# Patient Record
Sex: Female | Born: 2000 | Race: White | Hispanic: No | Marital: Single | Smoking: Current every day smoker
Health system: Southern US, Community
[De-identification: ages and names within clinical notes are randomized; demographics above are authoritative.]

---
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
## 2021-12-29 ENCOUNTER — Emergency Department (HOSPITAL_COMMUNITY): Payer: Medicaid Other

## 2021-12-29 ENCOUNTER — Encounter (HOSPITAL_COMMUNITY): Payer: Self-pay

## 2021-12-29 ENCOUNTER — Emergency Department (HOSPITAL_COMMUNITY)
Admission: EM | Admit: 2021-12-29 | Discharge: 2021-12-29 | Disposition: A | Discharge status: Home / Self Care | Payer: Medicaid Other | Attending: Emergency Medicine | Admitting: Emergency Medicine

## 2021-12-29 ENCOUNTER — Other Ambulatory Visit: Payer: Self-pay

## 2021-12-29 DIAGNOSIS — R102 Pelvic and perineal pain: Secondary | ICD-10-CM | POA: Insufficient documentation

## 2021-12-29 DIAGNOSIS — N939 Abnormal uterine and vaginal bleeding, unspecified: Secondary | ICD-10-CM | POA: Insufficient documentation

## 2021-12-29 LAB — CBC WITH DIFFERENTIAL/PLATELET
Abs Immature Granulocytes: 0.03 10*3/uL (ref 0.00–0.07)
Basophils Absolute: 0 10*3/uL (ref 0.0–0.1)
Basophils Relative: 0 %
Eosinophils Absolute: 0.1 10*3/uL (ref 0.0–0.5)
Eosinophils Relative: 1 %
HCT: 40.8 % (ref 36.0–46.0)
Hemoglobin: 13.6 g/dL (ref 12.0–15.0)
Immature Granulocytes: 0 %
Lymphocytes Relative: 29 %
Lymphs Abs: 3.1 10*3/uL (ref 0.7–4.0)
MCH: 30 pg (ref 26.0–34.0)
MCHC: 33.3 g/dL (ref 30.0–36.0)
MCV: 89.9 fL (ref 80.0–100.0)
Monocytes Absolute: 0.6 10*3/uL (ref 0.1–1.0)
Monocytes Relative: 6 %
Neutro Abs: 6.8 10*3/uL (ref 1.7–7.7)
Neutrophils Relative %: 64 %
Platelets: 294 10*3/uL (ref 150–400)
RBC: 4.54 MIL/uL (ref 3.87–5.11)
RDW: 12.4 % (ref 11.5–15.5)
WBC: 10.6 10*3/uL — ABNORMAL HIGH (ref 4.0–10.5)
nRBC: 0 % (ref 0.0–0.2)

## 2021-12-29 LAB — URINALYSIS, ROUTINE W REFLEX MICROSCOPIC
Bilirubin Urine: NEGATIVE
Glucose, UA: NEGATIVE mg/dL
Ketones, ur: NEGATIVE mg/dL
Leukocytes,Ua: NEGATIVE
Nitrite: NEGATIVE
Protein, ur: NEGATIVE mg/dL
RBC / HPF: >50 RBC/hpf — ABNORMAL HIGH (ref 0–5)
Specific Gravity, Urine: 1.008 (ref 1.005–1.030)
pH: 7 (ref 5.0–8.0)

## 2021-12-29 LAB — BASIC METABOLIC PANEL
Anion gap: 7 (ref 5–15)
BUN: 10 mg/dL (ref 6–20)
CO2: 23 mmol/L (ref 22–32)
Calcium: 9.6 mg/dL (ref 8.9–10.3)
Chloride: 110 mmol/L (ref 98–111)
Creatinine, Ser: 0.53 mg/dL (ref 0.44–1.00)
GFR, Estimated: >60 mL/min (ref >60)
Glucose, Bld: 94 mg/dL (ref 70–99)
Potassium: 4.4 mmol/L (ref 3.5–5.1)
Sodium: 140 mmol/L (ref 135–145)

## 2021-12-29 LAB — PREGNANCY, URINE: Preg Test, Ur: NEGATIVE

## 2021-12-29 MED ORDER — IBUPROFEN 200 MG PO TABS
400.0000 mg | ORAL_TABLET | Freq: Once | ORAL | Status: AC
  Administered 2021-12-29: 400 mg via ORAL
  Filled 2021-12-29: qty 2

## 2021-12-29 MED ORDER — ACETAMINOPHEN 500 MG PO TABS
1000.0000 mg | ORAL_TABLET | Freq: Once | ORAL | Status: AC
  Administered 2021-12-29: 1000 mg via ORAL
  Filled 2021-12-29: qty 2

## 2021-12-29 NOTE — ED Provider Notes (Signed)
[facility]   CSN: [id number] Arrival date & time: 12/29/21  1043     History  Chief Complaint  Patient presents with   Vaginal Bleeding    Denise Hendrix is a 21 y.o. female.  Patient is a 21 year old female presenting today with complaints of left pelvic pain and vaginal bleeding.  Patient reports that the pain is now been present for 2 to 3 days but she has had it intermittently in the past.  She had her last menstrual cycle at the end of May which she reports at times are heavier than others but then a few days ago when this pain started she started getting vaginal spotting as well.  She reports that she is using 1-2 pads per day.  However the pain is getting more severe and she had to leave work today.  She denies any urinary symptoms.  She was last sexually active 3 weeks ago but always uses protection.  She is on birth control which she has been on consistently since last July.  She denies any fever or vomiting but has had nausea.  She has not taken any medication for the pain.  No prior pelvic surgeries but does report an abortion last fall at approximately 3 months pregnant.  The history is provided by the patient.  Vaginal Bleeding      Home Medications Prior to Admission medications   Medication Sig Start Date End Date Taking? Authorizing Provider  Calcium Carbonate Antacid (TUMS PO) Take 2 tablets by mouth daily as needed (diarrhea).   Yes [provider]  ibuprofen (ADVIL) 200 MG tablet Take 400 mg by mouth daily as needed (pain).   Yes [provider]  Multiple Vitamin (MULTIVITAMIN PO) Take 2 tablets by mouth daily.   Yes [provider]  norgestimate-ethinyl estradiol (ORTHO-CYCLEN) 0.25-35 MG-MCG tablet Take 1 tablet by mouth daily. 12/13/21  Yes [provider]  cephALEXin (KEFLEX) 500 MG capsule Take 1 capsule (500 mg total) by mouth 3 (three) times daily. Patient not taking:  Reported on 12/29/2021 01/13/16   Trixie Dredge, PA-C  phenazopyridine (PYRIDIUM) 200 MG tablet Take 1 tablet (200 mg total) by mouth 3 (three) times daily. Patient not taking: Reported on 12/29/2021 01/13/16   Trixie Dredge, PA-C      Allergies    Patient has no known allergies.    Review of Systems   Review of Systems  Genitourinary:  Positive for vaginal bleeding.    Physical Exam Updated Vital Signs BP 100/89 (BP Location: Right Arm)   Pulse 79   Temp 99.7 F (37.6 C) (Oral)   Resp 18   Ht 5' 1.5" (1.562 m)   Wt 91.6 kg   LMP 12/26/2021   SpO2 100%   BMI 37.53 kg/m  Physical Exam Vitals and nursing note reviewed.  Constitutional:      General: She is not in acute distress.    Appearance: She is well-developed.     Comments: Patient is currently tearful and reports she is dealing with an anxiety attack  HENT:     Head: Normocephalic and atraumatic.  Eyes:     Pupils: Pupils are equal, round, and reactive to light.  Cardiovascular:     Rate and Rhythm: Normal rate and regular rhythm.     Heart sounds: Normal heart sounds. No murmur heard.    No friction rub.  Pulmonary:     Effort: Pulmonary effort is normal.     Breath  sounds: Normal breath sounds. No wheezing or rales.  Abdominal:     General: Bowel sounds are normal. There is no distension.     Palpations: Abdomen is soft.     Tenderness: There is no abdominal tenderness. There is no guarding or rebound.    Musculoskeletal:        General: No tenderness. Normal range of motion.     Comments: No edema  Skin:    General: Skin is warm and dry.     Findings: No rash.  Neurological:     Mental Status: She is alert and oriented to person, place, and time. Mental status is at baseline.     Cranial Nerves: No cranial nerve deficit.  Psychiatric:        Mood and Affect: Mood is anxious. Affect is tearful.        Behavior: Behavior normal.     ED Results / Procedures / Treatments   Labs (all labs ordered are  listed, but only abnormal results are displayed) Labs Reviewed  CBC WITH DIFFERENTIAL/PLATELET - Abnormal; Notable for the following components:      Result Value   WBC 10.6 (*)    All other components within normal limits  URINALYSIS, ROUTINE W REFLEX MICROSCOPIC - Abnormal; Notable for the following components:   Color, Urine STRAW (*)    Hgb urine dipstick LARGE (*)    RBC / HPF >50 (*)    Bacteria, UA RARE (*)    All other components within normal limits  BASIC METABOLIC PANEL  PREGNANCY, URINE    EKG None  Radiology US Pelvis Complete  Result Date: 12/29/2021 CLINICAL DATA:  Vaginal bleeding and pelvic pain EXAM: TRANSABDOMINAL AND TRANSVAGINAL ULTRASOUND OF PELVIS DOPPLER ULTRASOUND OF OVARIES TECHNIQUE: Both transabdominal and transvaginal ultrasound examinations of the pelvis were performed. Transabdominal technique was performed for global imaging of the pelvis including uterus, ovaries, adnexal regions, and pelvic cul-de-sac. It was necessary to proceed with endovaginal exam following the transabdominal exam to visualize the uterus and ovaries. Color and duplex Doppler ultrasound was utilized to evaluate blood flow to the ovaries. COMPARISON:  None Available. FINDINGS: Uterus Measurements: 6.0 x 2.5 x 3.6 cm = volume: 29 mL. No fibroids or other mass visualized. Nabothian cyst noted. Endometrium Thickness: 3.3 mm.  No focal abnormality visualized. Right ovary Measurements: 1.7 x 1.6 x 1.5 cm = volume: 2.0 mL. Normal appearance/no adnexal mass. Left ovary Measurements: 3.9 x 1.7 x 1.6 cm = volume: 5.4 mL. Normal appearance/no adnexal mass. Pulsed Doppler evaluation of both ovaries demonstrates normal low-resistance arterial and venous waveforms. Other findings No abnormal free fluid. IMPRESSION: Normal pelvic ultrasound. Electronically Signed   By: Allegra Lai M.D.   On: 12/29/2021 15:50   US Transvaginal Non-OB  Result Date: 12/29/2021 CLINICAL DATA:  Vaginal bleeding and  pelvic pain EXAM: TRANSABDOMINAL AND TRANSVAGINAL ULTRASOUND OF PELVIS DOPPLER ULTRASOUND OF OVARIES TECHNIQUE: Both transabdominal and transvaginal ultrasound examinations of the pelvis were performed. Transabdominal technique was performed for global imaging of the pelvis including uterus, ovaries, adnexal regions, and pelvic cul-de-sac. It was necessary to proceed with endovaginal exam following the transabdominal exam to visualize the uterus and ovaries. Color and duplex Doppler ultrasound was utilized to evaluate blood flow to the ovaries. COMPARISON:  None Available. FINDINGS: Uterus Measurements: 6.0 x 2.5 x 3.6 cm = volume: 29 mL. No fibroids or other mass visualized. Nabothian cyst noted. Endometrium Thickness: 3.3 mm.  No focal abnormality visualized. Right ovary Measurements: 1.7 x  1.6 x 1.5 cm = volume: 2.0 mL. Normal appearance/no adnexal mass. Left ovary Measurements: 3.9 x 1.7 x 1.6 cm = volume: 5.4 mL. Normal appearance/no adnexal mass. Pulsed Doppler evaluation of both ovaries demonstrates normal low-resistance arterial and venous waveforms. Other findings No abnormal free fluid. IMPRESSION: Normal pelvic ultrasound. Electronically Signed   By: Allegra Lai M.D.   On: 12/29/2021 15:50   [place] Art/Ven Flow Abd Pelv Doppler  Result Date: 12/29/2021 CLINICAL DATA:  Vaginal bleeding and pelvic pain EXAM: TRANSABDOMINAL AND TRANSVAGINAL ULTRASOUND OF PELVIS DOPPLER ULTRASOUND OF OVARIES TECHNIQUE: Both transabdominal and transvaginal ultrasound examinations of the pelvis were performed. Transabdominal technique was performed for global imaging of the pelvis including uterus, ovaries, adnexal regions, and pelvic cul-de-sac. It was necessary to proceed with endovaginal exam following the transabdominal exam to visualize the uterus and ovaries. Color and duplex Doppler ultrasound was utilized to evaluate blood flow to the ovaries. COMPARISON:  None Available. FINDINGS: Uterus Measurements: 6.0 x 2.5 x  3.6 cm = volume: 29 mL. No fibroids or other mass visualized. Nabothian cyst noted. Endometrium Thickness: 3.3 mm.  No focal abnormality visualized. Right ovary Measurements: 1.7 x 1.6 x 1.5 cm = volume: 2.0 mL. Normal appearance/no adnexal mass. Left ovary Measurements: 3.9 x 1.7 x 1.6 cm = volume: 5.4 mL. Normal appearance/no adnexal mass. Pulsed Doppler evaluation of both ovaries demonstrates normal low-resistance arterial and venous waveforms. Other findings No abnormal free fluid. IMPRESSION: Normal pelvic ultrasound. Electronically Signed   By: Allegra Lai M.D.   On: 12/29/2021 15:50    Procedures Procedures    Medications Ordered in ED Medications  acetaminophen (TYLENOL) tablet 1,000 mg (1,000 mg Oral Given 12/29/21 1411)  ibuprofen (ADVIL) tablet 400 mg (400 mg Oral Given 12/29/21 1411)    ED Course/ Medical Decision Making/ A&P                           Medical Decision Making Amount and/or Complexity of Data Reviewed External Data Reviewed: notes. Labs: ordered. Decision-making details documented in ED Course. Radiology: ordered and independent interpretation performed. Decision-making details documented in ED Course.  Risk OTC drugs.   Patient is a healthy female presenting with a complaint that can carry high concern for morbidity and mortality presenting today with left pelvic pain.  She is having associated vaginal spotting.  She does take OCPs and does not have a prior history of [place] pathology.  She denies any urinary pathology but concern for renal stone versus ovarian cyst versus intermittent torsion versus PID.  Lower suspicion for pregnancy as patient's menses have been regular and she is taking OCPs.  Patient currently has reduced pain just with time and is now 5 out of 10.  Will give Tylenol and ibuprofen.  Ultrasound and labs are pending.  4:11 PM I have independently visualized and interpreted pt's images today. Ultrasound today without evidence of ovarian  cysts.  Radiology reports a normal pelvic ultrasound with Doppler to both ovaries being low resistance arterial and venous waveforms.  I independently interpreted patient's labs today which showed negative pregnancy, blood in the UA which could be contaminant from known vaginal bleeding, normal CBC and BMP.  Findings were discussed with the patient.  This has been an ongoing issue for her.  Did discuss the possibility that this could be a renal stone and discussed renal CT however with shared decision making patient did not want to undergo any further testing at this  time.  Even if it was renal stone should pass on its own and normal renal function.  No evidence of infection.  Low suspicion for torsion at this time.  Encourage patient to follow-up with OB/GYN and continue on her OCPs at this time.  She was given return precautions.        Final Clinical Impression(s) / ED Diagnoses Final diagnoses:  Pelvic pain  Abnormal uterine bleeding (AUB)    Rx / DC Orders ED Discharge Orders     None         Gwyneth Sprout, MD 12/29/21 1611

## 2021-12-29 NOTE — ED Provider Triage Note (Signed)
Emergency Medicine Provider Triage Evaluation Note  Denise Hendrix , a 21 y.o. female  was evaluated in triage.  Pt complains of left pelvic pain and flank pain associated with vaginal bleeding of about 2 to 3-day duration.  Reports heavy vaginal bleeding.  Does take birth control.  Does not have a GYN.  Intermittent lightheadedness.  Currently denies lightheadedness.  Denies nausea, vomiting, or fever.  Review of Systems  Positive: As above Negative: As above  Physical Exam  BP 130/79 (BP Location: Left Arm)   Pulse 82   Temp 99.7 F (37.6 C) (Oral)   Resp 14   Ht 5' 1.5" (1.562 m)   Wt 91.6 kg   LMP 12/26/2021   SpO2 100%   BMI 37.53 kg/m  Gen:   Awake, no distress  Resp:  Normal effort  MSK:   Moves extremities without difficulty Other:  Left lower quadrant abdominal tenderness.  Mild left flank tenderness present  Medical Decision Making  Medically screening exam initiated at 12:38 PM.  Appropriate orders placed.  Denise Hendrix was informed that the remainder of the evaluation will be completed by another provider, this initial triage assessment does not replace that evaluation, and the importance of remaining in the ED until their evaluation is complete.     Marita Kansas, PA-C 12/29/21 1239

## 2021-12-29 NOTE — Discharge Instructions (Signed)
The ultrasound was normal today and all the labs look normal except for blood in your urine.  It is possible that this could be a kidney stone however you would just take Tylenol and ibuprofen until it passes.  It is more likely to be something related to your female organs possibly endometriosis.  I encourage you to continue your birth control pill at this time but it is important that you follow-up with an OB/GYN.  If you start having high fever, vomiting, excruciating pain please return to the emergency room.

## 2021-12-29 NOTE — ED Triage Notes (Addendum)
Patient c/o heavy vaginal bleeding with clots x 2-3 days.  Patient also c/o left lower abdominal pain that radiates into the the left lower back.

## 2022-06-24 ENCOUNTER — Ambulatory Visit
Admission: EM | Admit: 2022-06-24 | Discharge: 2022-06-24 | Disposition: A | Discharge status: Home / Self Care | Payer: Medicaid Other | Attending: Physician Assistant | Admitting: Physician Assistant

## 2022-06-24 DIAGNOSIS — S43402A Unspecified sprain of left shoulder joint, initial encounter: Secondary | ICD-10-CM

## 2022-06-24 MED ORDER — TIZANIDINE HCL 4 MG PO TABS: 4.0000 mg | ORAL_TABLET | Freq: Four times a day (QID) | ORAL | 0 refills | Status: AC | PRN

## 2022-06-24 NOTE — ED Provider Notes (Signed)
[facility]    CSN: [id number] Arrival date & time: 06/24/22  1433      History   Chief Complaint No chief complaint on file.   HPI Denise Hendrix is a 21 y.o. female.   Patient here for evaluation of L shoulder / back pain x 1 day.  This has been on going since 12/2021 when she had work related injury. She reports she has attempted to file [facility] but has been denied.  She states she was at work when she was removing box of tea from shelf and felt pain in shoulder.  She has been taking nsaids w/o relief.      History reviewed. No pertinent past medical history.  There are no problems to display for this patient.   History reviewed. No pertinent surgical history.  OB History   No obstetric history on file.      Home Medications    Prior to Admission medications   Medication Sig Start Date End Date Taking? Authorizing Provider  tiZANidine (ZANAFLEX) 4 MG tablet Take 1 tablet (4 mg total) by mouth every 6 (six) hours as needed for muscle spasms. 06/24/22  Yes Peri Jefferson, PA-C  Calcium Carbonate Antacid (TUMS PO) Take 2 tablets by mouth daily as needed (diarrhea).    [provider]  cephALEXin (KEFLEX) 500 MG capsule Take 1 capsule (500 mg total) by mouth 3 (three) times daily. Patient not taking: Reported on 12/29/2021 01/13/16   Clayton Bibles, PA-C  ibuprofen (ADVIL) 200 MG tablet Take 400 mg by mouth daily as needed (pain).    [provider]  Multiple Vitamin (MULTIVITAMIN PO) Take 2 tablets by mouth daily.    [provider]  norgestimate-ethinyl estradiol (ORTHO-CYCLEN) 0.25-35 MG-MCG tablet Take 1 tablet by mouth daily. 12/13/21   [provider]  phenazopyridine (PYRIDIUM) 200 MG tablet Take 1 tablet (200 mg total) by mouth 3 (three) times daily. Patient not taking: Reported on 12/29/2021 01/13/16   Clayton Bibles, PA-C    Family History History reviewed. No pertinent family history.  Social History Social History    Tobacco Use   Smoking status: Every Day    Types: E-cigarettes  Vaping Use   Vaping Use: Some days   Substances: Nicotine, Flavoring  Substance Use Topics   Alcohol use: Never   Drug use: Never     Allergies   Patient has no known allergies.   Review of Systems Review of Systems  Musculoskeletal:  Positive for arthralgias and myalgias. Negative for joint swelling.  Skin:  Negative for color change and wound.  Neurological:  Positive for weakness. Negative for numbness.  Hematological:  Negative for adenopathy. Does not bruise/bleed easily.     Physical Exam Triage Vital Signs ED Triage Vitals  Enc Vitals Group     BP 06/24/22 1604 123/63     Pulse Rate 06/24/22 1604 70     Resp 06/24/22 1604 16     Temp 06/24/22 1604 98.9 F (37.2 C)     Temp Source 06/24/22 1604 Oral     SpO2 06/24/22 1604 100 %     Weight --      Height --      Head Circumference --      Peak Flow --      Pain Score 06/24/22 1610 6     Pain Loc --      Pain Edu? --      Excl. in [place]? --    No data  found.  Updated Vital Signs BP 123/63 (BP Location: Right Arm)   Pulse 70   Temp 98.9 F (37.2 C) (Oral)   Resp 16   LMP 06/21/2022   SpO2 100%   Visual Acuity Right Eye Distance:   Left Eye Distance:   Bilateral Distance:    Right Eye Near:   Left Eye Near:    Bilateral Near:     Physical Exam Vitals and nursing note reviewed.  Constitutional:      General: She is not in acute distress.    Appearance: Normal appearance. She is not ill-appearing.  HENT:     Head: Normocephalic and atraumatic.  Eyes:     General: No scleral icterus.    Extraocular Movements: Extraocular movements intact.     Conjunctiva/sclera: Conjunctivae normal.  Cardiovascular:     Rate and Rhythm: Normal rate and regular rhythm.     Heart sounds: No murmur heard. Pulmonary:     Effort: Pulmonary effort is normal. No respiratory distress.     Breath sounds: Normal breath sounds. No wheezing or rales.   Musculoskeletal:     Cervical back: Normal range of motion. No rigidity.     Comments: Tenderness L posterior shoulder, ROM grossly intact though pain with abduction above her head.  Pain L side upper thoracic region along trapezious  Skin:    Coloration: Skin is not jaundiced.     Findings: No rash.  Neurological:     General: No focal deficit present.     Mental Status: She is alert and oriented to person, place, and time.     Motor: No weakness.     Gait: Gait normal.  Psychiatric:        Mood and Affect: Mood normal.        Behavior: Behavior normal.      UC Treatments / Results  Labs (all labs ordered are listed, but only abnormal results are displayed) Labs Reviewed - No data to display  EKG   Radiology No results found.  Procedures Procedures (including critical care time)  Medications Ordered in UC Medications - No data to display  Initial Impression / Assessment and Plan / UC Course  I have reviewed the triage vital signs and the nursing notes.  Pertinent labs & imaging results that were available during my care of the patient were reviewed by me and considered in my medical decision making (see chart for details).     Follow up with ortho. Apply ice and heat to shoulder 15 minutes 4 times per day No lifting over 5 pounds with L arm Final Clinical Impressions(s) / UC Diagnoses   Final diagnoses:  Sprain of left shoulder, unspecified shoulder sprain type, initial encounter     Discharge Instructions      Follow up with ortho. Apply ice and heat to shoulder 15 minutes 4 times per day    ED Prescriptions     Medication Sig Dispense Auth. Provider   tiZANidine (ZANAFLEX) 4 MG tablet Take 1 tablet (4 mg total) by mouth every 6 (six) hours as needed for muscle spasms. 30 tablet Evern Core, PA-C      PDMP not reviewed this encounter.   Evern Core, PA-C 06/24/22 1637

## 2022-06-24 NOTE — Discharge Instructions (Signed)
Follow up with ortho. Apply ice and heat to shoulder 15 minutes 4 times per day

## 2022-06-24 NOTE — ED Triage Notes (Addendum)
Pt states she was dx with "pulled trap muscle" in June after an injury at work-c/o pain to area again after injury at work yesterday-NAD-steady gait

## 2022-06-28 ENCOUNTER — Ambulatory Visit (INDEPENDENT_AMBULATORY_CARE_PROVIDER_SITE_OTHER): Payer: Medicaid Other

## 2022-06-28 ENCOUNTER — Ambulatory Visit (INDEPENDENT_AMBULATORY_CARE_PROVIDER_SITE_OTHER): Payer: Medicaid Other | Admitting: Surgical

## 2022-06-28 DIAGNOSIS — M25512 Pain in left shoulder: Secondary | ICD-10-CM

## 2022-06-28 MED ORDER — NAPROXEN 500 MG PO TABS: 500.0000 mg | ORAL_TABLET | Freq: Two times a day (BID) | ORAL | 0 refills | Status: AC

## 2022-07-02 ENCOUNTER — Encounter: Payer: Self-pay | Admitting: Surgical

## 2022-07-02 NOTE — Progress Notes (Signed)
Office Visit Note   Patient: Denise Hendrix           Date of Birth: 08/25/2000           MRN: [id number] Visit Date: 06/28/2022 Requested by: Paschal Dopp, PA [address] [place],  [place] [address] PCP: Paschal Dopp, PA  Subjective: Chief Complaint  Patient presents with   Left Shoulder - Pain    HPI: Denise Hendrix is a 21 y.o. female who presents to the office reporting left shoulder pain.  Patient states that she has a history of left shoulder pain stemming from an injury on 12/24/2021 that occurred at work.  She was pulling down a pan off of a high shelf at [employer].  This pain was a lot heavier than she was expecting and she felt a pull in her left shoulder as she brought it down and had to actually drop it on the floor just because she could not hold it up.  She had pain at that time but this subsided until the next morning when she had increased pain and had difficulty using her left arm.  She had difficulty laying on her left side at that time and she states she tried to have that covered under [employer]. but it was denied.  Her pain improved about 85% before she recently reinjured the shoulder last Friday.  She was pushing a approximately 20 pound box and caught the box on the other side of a shelf and it pulled her shoulder again.  This is the same shoulder that was previously injured.  She has noticed that she has decreased range of motion secondary to pain.  Cannot really lift without any pain in the shoulder.  Mostly localizes pain to the lateral aspect of the shoulder as well as the posterior aspect and the trapezius region.  Denies any neck pain, numbness/tingling, medial border scapular pain, radicular pain down the arm.  She was given muscle relaxer at urgent care.  Cannot lay on her left side again.  No history of prior dislocation or surgery to the shoulder.  In her free time she enjoys dancing and spending time with her boyfriend..  She does not do  any major sporting events or weightlifting              ROS: All systems reviewed are negative as they relate to the chief complaint within the history of present illness.  Patient denies fevers or chills.  Assessment & Plan: Visit Diagnoses:  1. Left shoulder pain, unspecified chronicity     Plan: Patient is a 21 year old female who presents for evaluation of left shoulder pain.  She has had index injury of the left shoulder on 12/24/2021 when she injured herself while working at [employer].  She had Worker's Comp. claim that was denied and she is pursuing legal intervention in order to get this covered.  She initially got about 85% better after that injury up until last Friday when she reinjured her left shoulder and has had recurrence of similar pain.  She has radiographs demonstrating no significant osseous abnormality.  No rotator cuff weakness but she does have positive O'Brien sign on exam.  Discussed the options available to patient including trying formal physical therapy versus injection versus MRI arthrogram for further evaluation of potential labral injury.  Not having any instability at this time.  She would like to proceed with MRI arthrogram which is reasonable given the longstanding nature of her symptoms over the last  6 months.  Follow-up after MRI to review results.  Follow-Up Instructions: No follow-ups on file.   Orders:  Orders Placed This Encounter  Procedures   XR Shoulder Left   MR Shoulder Left w/ contrast   Arthrogram   Meds ordered this encounter  Medications   naproxen (NAPROSYN) 500 MG tablet    Sig: Take 1 tablet (500 mg total) by mouth 2 (two) times daily with a meal.    Dispense:  28 tablet    Refill:  0      Procedures: No procedures performed   Clinical Data: No additional findings.  Objective: Vital Signs: LMP 06/21/2022   Physical Exam:  Constitutional: Patient appears well-developed HEENT:  Head: Normocephalic Eyes:EOM are normal Neck:  Normal range of motion Cardiovascular: Normal rate Pulmonary/chest: Effort normal Neurologic: Patient is alert Skin: Skin is warm Psychiatric: Patient has normal mood and affect  Ortho Exam: Ortho exam demonstrates left shoulder with 70 degrees external rotation, 100 degrees abduction, 170 degrees forward flexion which is equivalent to the right shoulder range of motion.  She has excellent rotator cuff strength of supra, infra, subscap of both shoulders without any coarseness or grinding with passive motion of the shoulder.  No tenderness over the [facility] joint.  No tenderness over the acromion.  No tenderness over the bicipital groove.  Does have positive O'Brien sign.  No tenderness over the axial cervical spine.  Negative Spurling sign.  Negative Lhermitte sign.  Intact EPL, FPL, grip strength, finger abduction, pronation/supination, bicep, tricep, deltoid.  Axillary nerve intact with deltoid firing.  Negative apprehension sign.  Negative Hornblower sign.  Negative external rotation lag sign.  Specialty Comments:  No specialty comments available.  Imaging: No results found.   PMFS History: There are no problems to display for this patient.  No past medical history on file.  No family history on file.  No past surgical history on file. Social History   Occupational History   Not on file  Tobacco Use   Smoking status: Every Day    Types: E-cigarettes   Smokeless tobacco: Not on file  Vaping Use   Vaping Use: Some days   Substances: Nicotine, Flavoring  Substance and Sexual Activity   Alcohol use: Never   Drug use: Never   Sexual activity: Not on file

## 2022-07-25 ENCOUNTER — Ambulatory Visit
Admission: RE | Admit: 2022-07-25 | Discharge: 2022-07-25 | Disposition: A | Discharge status: Home / Self Care | Payer: Medicaid Other | Source: Ambulatory Visit | Attending: Surgical | Admitting: Surgical

## 2022-07-25 DIAGNOSIS — M25512 Pain in left shoulder: Secondary | ICD-10-CM

## 2022-07-25 MED ORDER — IOPAMIDOL (ISOVUE-M 200) INJECTION 41%
13.0000 mL | Freq: Once | INTRAMUSCULAR | Status: AC
  Administered 2022-07-25: 13 mL via INTRA_ARTICULAR

## 2022-08-02 ENCOUNTER — Encounter: Payer: Self-pay | Admitting: Surgical

## 2022-08-02 ENCOUNTER — Ambulatory Visit (INDEPENDENT_AMBULATORY_CARE_PROVIDER_SITE_OTHER): Payer: Medicaid Other | Admitting: Surgical

## 2022-08-02 DIAGNOSIS — M25512 Pain in left shoulder: Secondary | ICD-10-CM | POA: Diagnosis not present

## 2022-08-02 NOTE — Progress Notes (Signed)
Office Visit Note   Patient: Denise Hendrix           Date of Birth: 22-Sep-2000           MRN: [id number] Visit Date: 08/02/2022 Requested by: Milford Cage, [place],  [place] [address] PCP: Milford Cage, PA  Subjective: Chief Complaint  Patient presents with   Left Shoulder - Pain    HPI: Denise Hendrix is a 22 y.o. female who presents to the office for MRI review. Patient denies any changes in symptoms aside from her symptoms being slightly improved.  Continues to complain mainly of occasional clicking sensation in the posterior aspect of the shoulder.  Has primarily posterior pain with no scapular pain or neck pain.  Occasional anterior pain as well.  MRI results revealed: MR Shoulder Left w/ contrast  Result Date: 07/26/2022 CLINICAL DATA:  eval labral tear EXAM: MRI OF THE LEFT SHOULDER WITH CONTRAST TECHNIQUE: Multiplanar, multisequence MR imaging of the left shoulder was performed following the administration of intra-articular contrast. CONTRAST:  See Injection Documentation. COMPARISON:  None Available. FINDINGS: Rotator cuff: The supraspinatus and infraspinatus tendons are intact. Teres minor tendon is intact. Subscapularis tendon is intact. Muscles: No significant muscle atrophy. Biceps Long Head: Intraarticular and extraarticular portions of the biceps tendon are intact. Acromioclavicular Joint: No significant arthropathy of the acromioclavicular joint. No significant subacromial/subdeltoid bursal fluid . Glenohumeral Joint: Adequate joint distension by arthrogram technique. No chondral defect. Labrum: Slight undercutting of the the posteroinferior labrum from approximately 7:00-8:00 (series 6 images 14 and 15, consistent with small nondisplaced tear. Focal irregularity of the anterior superior labrum at 1:00 with undercutting contrast from 12:00-1:00. Bones: No evidence of acute fracture.  No aggressive osseous lesion. Other: No focal fluid  collection. IMPRESSION: Small nondisplaced tear of the posteroinferior labrum from 7:00-8:00. Focal irregularity of the anterior superior labrum at 1:00 with undercutting contrast from 12:00-1:00, favored to represent a small additional nondisplaced tear, versus normal variant. No evidence of rotator cuff tear. Electronically Signed   By: Maurine Simmering M.D.   On: 07/26/2022 10:37                 ROS: All systems reviewed are negative as they relate to the chief complaint within the history of present illness.  Patient denies fevers or chills.  Assessment & Plan: Visit Diagnoses:  1. Left shoulder pain, unspecified chronicity     Plan: Denise Hendrix is a 22 y.o. female who presents to the office for evaluation of left shoulder pain and review of left shoulder MRI.  She has small nondisplaced labral tear in the posterior inferior labrum.  A little bit of tenderness over the bicipital groove.  Has had improvement since last visit.  Plan to try physical therapy 1 time a week for 2 months.  Follow-up in 2 months for clinical recheck and if she has continued pain, could consider glenohumeral injection under ultrasound.  Patient agreed with this plan.  Follow-Up Instructions: No follow-ups on file.   Orders:  Orders Placed This Encounter  Procedures   Ambulatory referral to Physical Therapy   No orders of the defined types were placed in this encounter.     Procedures: No procedures performed   Clinical Data: No additional findings.  Objective: Vital Signs: There were no vitals taken for this visit.  Physical Exam:  Constitutional: Patient appears well-developed HEENT:  Head: Normocephalic Eyes:EOM are normal Neck: Normal range of motion Cardiovascular: Normal rate Pulmonary/chest: Effort  normal Neurologic: Patient is alert Skin: Skin is warm Psychiatric: Patient has normal mood and affect  Ortho Exam: Ortho exam demonstrates left shoulder with 60 degrees X rotation, 120  degrees abduction, 180 degrees forward elevation.  Axillary nerve intact with deltoid firing.  Excellent rotator cuff strength of supra, infra, subscap without really much reproduction of pain.  Positive O'Brien sign.  Tenderness over bicipital groove.  Negative apprehension sign.  Negative Hornblower sign.  Negative external rotation lag sign.  Specialty Comments:  No specialty comments available.  Imaging: No results found.   PMFS History: There are no problems to display for this patient.  No past medical history on file.  No family history on file.  No past surgical history on file. Social History   Occupational History   Not on file  Tobacco Use   Smoking status: Every Day    Types: E-cigarettes   Smokeless tobacco: Not on file  Vaping Use   Vaping Use: Some days   Substances: Nicotine, Flavoring  Substance and Sexual Activity   Alcohol use: Never   Drug use: Never   Sexual activity: Not on file

## 2022-08-11 NOTE — Therapy (Signed)
OUTPATIENT PHYSICAL THERAPY SHOULDER EVALUATION   Patient Name: Denise Hendrix MRN: [id number] DOB:2000-09-09, 22 y.o., female Today's Date: 08/14/2022  END OF SESSION:  PT End of Session - 08/14/22 1550     Visit Number 1    Number of Visits 17    Date for PT Re-Evaluation 10/09/22    Authorization Type [facility] [employer]    Authorization Time Period no auth first 12 visits    Authorization - Visit Number 1    Authorization - Number of Visits 12    PT Start Time 1551   late check in   PT Stop Time 1629    PT Time Calculation (min) 38 min    Activity Tolerance Patient tolerated treatment well;No increased pain    Behavior During Therapy [facility] for tasks assessed/performed             History reviewed. No pertinent past medical history. History reviewed. No pertinent surgical history. There are no problems to display for this patient.   PCP: Paschal Dopp, PA  REFERRING PROVIDER: Julieanne Cotton, PA-C  REFERRING DIAG: [id number] (ICD-10-CM) - Left shoulder pain, unspecified chronicity  THERAPY DIAG:  Left shoulder pain, unspecified chronicity  Muscle weakness (generalized)  Stiffness of left shoulder, not elsewhere classified  Rationale for Evaluation and Treatment: Rehabilitation  ONSET DATE: Summer 2023  SUBJECTIVE:                                                                                                                                                                                      SUBJECTIVE STATEMENT: Pt states she injured her shoulder June 2023 while at work, states she was pulling a box at work that strained her upper trap. States she "tore something" in the same shoulder while trying to catch a box in December. Progressively worsening since. Was having some shooting pain into biceps but that has improved. States symptoms are variable based on activity, most difficulty with drink containers (3.5 gallons) and ~35# sauce containers and  reaching overhead. Pt states anything over >10# she has significant difficulty with. Worse in morning. States she was encouraged to stretch her shoulder to mitigate stiffness but no other treatment reported thus far aside from medication No red flag symptoms endorsed  PERTINENT HISTORY: Unremarkable PMH  PAIN:  Are you having pain: 0/10  Location: L anterior/superior shoulder  How would you describe your pain? stabbing Best in past week: 0/10 Worst in past week: 7-8/10 usually eases within a few minutes, sometimes hours Aggravating factors: lifting, reaching overhead, laundry, reaching into cabinet, sleeping, upper body dressing (AM more so than PM) Easing factors: heat, medication, ointment   PRECAUTIONS:  None  WEIGHT BEARING RESTRICTIONS: No  FALLS:  Has patient fallen in last 6 months? No  LIVING ENVIRONMENT: Lives w/ Dad, splits housework together, 1 level home   OCCUPATION: [employer] - no official restrictions but taking it easier since injury  PLOF: Independent  PATIENT GOALS:  less pain, be able to participate in performances at [employer]  NEXT MD VISIT: nothing scheduled yet  OBJECTIVE:   DIAGNOSTIC FINDINGS:  MRI per [facility] 07/26/22 IMPRESSION: Small nondisplaced tear of the posteroinferior labrum from 7:00-8:00.   Focal irregularity of the anterior superior labrum at 1:00 with undercutting contrast from 12:00-1:00, favored to represent a small additional nondisplaced tear, versus normal variant.   No evidence of rotator cuff tear.  PATIENT SURVEYS:  QuickDASH: 47.7%  COGNITION: Overall cognitive status: Within functional limits for tasks assessed     SENSATION: Light touch intact BUE   POSTURE: Forward head, rounded shoulders, elevated UT  UPPER EXTREMITY ROM:  A/PROM Right eval Left eval  Shoulder flexion 168 142 s *  Shoulder abduction 166 136 s *  Shoulder internal rotation (functional) T10 T10 *  Shoulder external rotation  (functional) T1 C7 *  Elbow flexion    Elbow extension    Wrist flexion    Wrist extension     (Blank rows = not tested) (Key: WFL = within functional limits not formally assessed, * = concordant pain, s = stiffness/stretching sensation, NT = not tested)  Comments:    UPPER EXTREMITY MMT:  MMT Right eval Left eval  Shoulder flexion    Shoulder extension    Shoulder abduction    Shoulder extension    Shoulder internal rotation 5 3+ *  Shoulder external rotation 5 3+ *  Elbow flexion 5 3+ *  Elbow extension 5 3+ *  Grip strength    (Blank rows = not tested)  (Key: WFL = within functional limits not formally assessed, * = concordant pain, s = stiffness/stretching sensation, NT = not tested)  Comments:   SHOULDER SPECIAL TESTS: Deferred given symptom irritability w/ ROM/MMT  PALPATION:  Concordant TTP L bicipital groove, deltoid, and infraspinatus/teres   TODAY'S TREATMENT:                                                                                                                                        [facility] Adult PT Treatment:                                                DATE: 08/14/22 Deferred given time constraints  PATIENT EDUCATION: Education details: Pt education on PT impairments, prognosis, and POC. Informed consent. Rationale for interventions Person educated: Patient Education method: Explanation, Demonstration, Tactile cues, Verbal cues Education comprehension: verbalized understanding, returned demonstration, verbal cues  required, tactile cues required, and needs further education    HOME EXERCISE PROGRAM: Deferred on eval due to time constraints   ASSESSMENT:  CLINICAL IMPRESSION: Patient is a pleasant 22 y.o. woman who was seen today for physical therapy evaluation and treatment for L shoulder pain since summer 2023, gradually worsening with exacerbation in Dec 2023. Pt states she has limitations in strength and mobility that are affecting her ability to  perform ADLs and work activities, particular difficulty with reaching overhead and lifting heavy items. Symptoms are irritable on examination today but eases back to baseline after a few minutes - pt does demo globally reduced UE MMT (elbow flex/ext and [facility] ER most provocative) and stiffness with GH AROM. Anticipate pt would benefit from skilled PT to address these deficits in order to maximize functional independence/tolerance. No adverse events, pt denies any increase in pain on departure. Pt departs today's session in no acute distress, all voiced questions/concerns addressed appropriately from PT perspective.    OBJECTIVE IMPAIRMENTS: decreased activity tolerance, decreased endurance, decreased mobility, decreased ROM, decreased strength, hypomobility, impaired perceived functional ability, increased muscle spasms, impaired UE functional use, and pain.   ACTIVITY LIMITATIONS: carrying, lifting, sleeping, dressing, reach over head, and hygiene/grooming  PARTICIPATION LIMITATIONS: cleaning, laundry, and occupation  PERSONAL FACTORS: Time since onset of injury/illness/exacerbation are also affecting patient's functional outcome.   REHAB POTENTIAL: Good  CLINICAL DECISION MAKING: Evolving/moderate complexity  EVALUATION COMPLEXITY: Low   GOALS: Goals reviewed with patient? No  SHORT TERM GOALS: Target date: 09/08/2022 Pt will demonstrate appropriate understanding and performance of initially prescribed HEP in order to facilitate improved independence with management of symptoms.  Baseline: HEP provided on eval Goal status: INITIAL   2. Pt will score less than or equal to 39% on Quick DASH in order to demonstrate improved perception of function due to symptoms.  Baseline: 47.7%  Goal status: INITIAL    LONG TERM GOALS: Target date: 10/06/2022 Pt will score less than or equal 30% on Quick DASH in order to demonstrate improved perception of function due to symptoms. Baseline: 47.7% Goal  status: INITIAL  2.  Pt will demonstrate at least 160 degrees of active L shoulder elevation in order to demonstrate improved tolerance to reaching overhead.  Baseline: see ROM chart above Goal status: INITIAL  3.  Pt will demonstrate at least 4+/5 shoulder L ER/IR MMT for improved symmetry of UE strength and improved tolerance to functional movements.  Baseline: see MMT chart above Goal status: INITIAL  4. Pt will report/demonstrate ability to perform 30# floor<>waist lift with less than 2 point increase in pain on NPS in order to indicative improved tolerance/independence with work tasks.  Baseline: reports up to 8/10 pain with heavy tasks at work  Goal status: INITIAL   PLAN:  PT FREQUENCY: 2x/week  PT DURATION: 8 weeks  PLANNED INTERVENTIONS: Therapeutic exercises, Therapeutic activity, Neuromuscular re-education, Balance training, Gait training, Patient/Family education, Self Care, Joint mobilization, Joint manipulation, Dry Needling, Electrical stimulation, Spinal manipulation, Spinal mobilization, Cryotherapy, Moist heat, Taping, Manual therapy, and Re-evaluation  PLAN FOR NEXT SESSION: Establish HEP, emphasis on gentle Wade stability/mobility  Leeroy Cha PT, DPT 08/14/2022 5:38 PM

## 2022-08-14 ENCOUNTER — Ambulatory Visit: Payer: Medicaid Other | Attending: Surgical | Admitting: Physical Therapy

## 2022-08-14 ENCOUNTER — Other Ambulatory Visit: Payer: Self-pay

## 2022-08-14 ENCOUNTER — Encounter: Payer: Self-pay | Admitting: Physical Therapy

## 2022-08-14 DIAGNOSIS — M25512 Pain in left shoulder: Secondary | ICD-10-CM | POA: Insufficient documentation

## 2022-08-14 DIAGNOSIS — M25612 Stiffness of left shoulder, not elsewhere classified: Secondary | ICD-10-CM | POA: Diagnosis present

## 2022-08-14 DIAGNOSIS — M6281 Muscle weakness (generalized): Secondary | ICD-10-CM | POA: Diagnosis present

## 2022-08-18 NOTE — Therapy (Signed)
OUTPATIENT PHYSICAL THERAPY TREATMENT NOTE   Patient Name: Denise Hendrix MRN: [id number] DOB:[birth year], 22 y.o., female Today's Date: 08/21/2022  PCP: Paschal Dopp, PA   REFERRING PROVIDER: Julieanne Cotton, PA-C  END OF SESSION:   PT End of Session - 08/21/22 1502     Visit Number 2    Number of Visits 17    Date for PT Re-Evaluation 10/09/22    Authorization Type [facility] [employer]    Authorization Time Period no auth first 12 visits    Authorization - Visit Number 2    Authorization - Number of Visits 12    PT Start Time 1502    PT Stop Time 1542    PT Time Calculation (min) 40 min    Activity Tolerance Patient tolerated treatment well    Behavior During Therapy [facility] for tasks assessed/performed             History reviewed. No pertinent past medical history. History reviewed. No pertinent surgical history. There are no problems to display for this patient.   REFERRING DIAG: M25.512 (ICD-10-CM) - Left shoulder pain, unspecified chronicity   THERAPY DIAG:  Left shoulder pain, unspecified chronicity  Muscle weakness (generalized)  Stiffness of left shoulder, not elsewhere classified  Rationale for Evaluation and Treatment Rehabilitation  PERTINENT HISTORY: Unremarkable PMH   PRECAUTIONS: none  SUBJECTIVE:                                                                                                                                                                                      SUBJECTIVE STATEMENT:   Pt reports 4/10 pain at present, no issues after last session. Continues to report fluctuations in symptoms based on activity.  PAIN:  Are you having pain: 0/10  Location: L anterior/superior shoulder  How would you describe your pain? stabbing Best in past week: 0/10 Worst in past week: 7-8/10 usually eases within a few minutes, sometimes hours Aggravating factors: lifting, reaching overhead, laundry, reaching into cabinet, sleeping, upper  body dressing (AM more so than PM) Easing factors: heat, medication, ointment    OBJECTIVE: (objective measures completed at initial evaluation unless otherwise dated)   DIAGNOSTIC FINDINGS:  MRI per [facility] 07/26/22 IMPRESSION: Small nondisplaced tear of the posteroinferior labrum from 7:00-8:00.   Focal irregularity of the anterior superior labrum at 1:00 with undercutting contrast from 12:00-1:00, favored to represent a small additional nondisplaced tear, versus normal variant.   No evidence of rotator cuff tear.   PATIENT SURVEYS:  QuickDASH: 47.7%   COGNITION: Overall cognitive status: Within functional limits for tasks assessed  SENSATION: Light touch intact BUE    POSTURE: Forward head, rounded shoulders, elevated UT   UPPER EXTREMITY ROM:   A/PROM Right eval Left eval  Shoulder flexion 168 142 s *  Shoulder abduction 166 136 s *  Shoulder internal rotation (functional) T10 T10 *  Shoulder external rotation (functional) T1 C7 *  Elbow flexion      Elbow extension      Wrist flexion      Wrist extension       (Blank rows = not tested) (Key: WFL = within functional limits not formally assessed, * = concordant pain, s = stiffness/stretching sensation, NT = not tested)  Comments:     UPPER EXTREMITY MMT:   MMT Right eval Left eval  Shoulder flexion      Shoulder extension      Shoulder abduction      Shoulder extension      Shoulder internal rotation 5 3+ *  Shoulder external rotation 5 3+ *  Elbow flexion 5 3+ *  Elbow extension 5 3+ *  Grip strength      (Blank rows = not tested)  (Key: WFL = within functional limits not formally assessed, * = concordant pain, s = stiffness/stretching sensation, NT = not tested)  Comments:    SHOULDER SPECIAL TESTS: Deferred given symptom irritability w/ ROM/MMT   PALPATION:  Concordant TTP L bicipital groove, deltoid, and infraspinatus/teres             TODAY'S TREATMENT:                                                                                                                                         [facility] Adult PT Treatment:                                                DATE: 08/21/22 Therapeutic Exercise: Scapular retraction 2x10 cues for form and posture  Swiss ball flexion up wall 2x5 cues for form and comfortable ROM  Swiss ball RC perturbations x15sec, 2x30sec cues for appropriate  ER iso x5 at column w/ towel support cues for setup IR iso 2x5 at column w/ towel support cues for setup Counter flexion stretch walkbacks 2x8 cues for form and appropriate ROM HEP handout + education    PATIENT EDUCATION: Education details: Rationale for interventions, HEP  Person educated: Patient Education method: Explanation, Demonstration, Tactile cues, Verbal cues, handout Education comprehension: verbalized understanding, returned demonstration, verbal cues required, tactile cues required, and needs further education     HOME EXERCISE PROGRAM: Access Code: [email or web address] URL: [email or web address] Date: 08/21/2022 Prepared by: Enis Slipper  Exercises - Standing 'L' Stretch at Counter  - 1 x daily - 7 x weekly - 2 sets - 6 reps -  Seated Scapular Retraction  - 1 x daily - 7 x weekly - 2 sets - 10 reps   ASSESSMENT:   CLINICAL IMPRESSION: 08/21/2022 Pt arrives with 4/10 pain, denies issues after first session. Today focusing on implementation of gentle postural/GH activity with emphasis on comfortable ROM and posture. No adverse events, does endorse muscular fatigue and gradual increase in pain to 6/10 as session goes on, improves w/ stretching at counter. 5/10 pain on departure, HEP handout provided w/ education. Pt departs today's session in no acute distress, all voiced questions/concerns addressed appropriately from PT perspective.     Eval - Patient is a pleasant 22 y.o. woman who was seen today for physical therapy evaluation and treatment for L shoulder  pain since summer 2023, gradually worsening with exacerbation in Dec 2023. Pt states she has limitations in strength and mobility that are affecting her ability to perform ADLs and work activities, particular difficulty with reaching overhead and lifting heavy items. Symptoms are irritable on examination today but eases back to baseline after a few minutes - pt does demo globally reduced UE MMT (elbow flex/ext and [facility] ER most provocative) and stiffness with GH AROM. Anticipate pt would benefit from skilled PT to address these deficits in order to maximize functional independence/tolerance. No adverse events, pt denies any increase in pain on departure. Pt departs today's session in no acute distress, all voiced questions/concerns addressed appropriately from PT perspective.     OBJECTIVE IMPAIRMENTS: decreased activity tolerance, decreased endurance, decreased mobility, decreased ROM, decreased strength, hypomobility, impaired perceived functional ability, increased muscle spasms, impaired UE functional use, and pain.    ACTIVITY LIMITATIONS: carrying, lifting, sleeping, dressing, reach over head, and hygiene/grooming   PARTICIPATION LIMITATIONS: cleaning, laundry, and occupation   PERSONAL FACTORS: Time since onset of injury/illness/exacerbation are also affecting patient's functional outcome.    REHAB POTENTIAL: Good   CLINICAL DECISION MAKING: Evolving/moderate complexity   EVALUATION COMPLEXITY: Low     GOALS: Goals reviewed with patient? No   SHORT TERM GOALS: Target date: 09/08/2022 Pt will demonstrate appropriate understanding and performance of initially prescribed HEP in order to facilitate improved independence with management of symptoms.  Baseline: HEP provided on eval Goal status: INITIAL    2. Pt will score less than or equal to 39% on Quick DASH in order to demonstrate improved perception of function due to symptoms.            Baseline: 47.7%            Goal status: INITIAL      LONG TERM GOALS: Target date: 10/06/2022 Pt will score less than or equal 30% on Quick DASH in order to demonstrate improved perception of function due to symptoms. Baseline: 47.7% Goal status: INITIAL   2.  Pt will demonstrate at least 160 degrees of active L shoulder elevation in order to demonstrate improved tolerance to reaching overhead.  Baseline: see ROM chart above Goal status: INITIAL   3.  Pt will demonstrate at least 4+/5 shoulder L ER/IR MMT for improved symmetry of UE strength and improved tolerance to functional movements.  Baseline: see MMT chart above Goal status: INITIAL   4. Pt will report/demonstrate ability to perform 30# floor<>waist lift with less than 2 point increase in pain on NPS in order to indicative improved tolerance/independence with work tasks.            Baseline: reports up to 8/10 pain with heavy tasks at work  Goal status: INITIAL    PLAN:   PT FREQUENCY: 2x/week   PT DURATION: 8 weeks   PLANNED INTERVENTIONS: Therapeutic exercises, Therapeutic activity, Neuromuscular re-education, Balance training, Gait training, Patient/Family education, Self Care, Joint mobilization, Joint manipulation, Dry Needling, Electrical stimulation, Spinal manipulation, Spinal mobilization, Cryotherapy, Moist heat, Taping, Manual therapy, and Re-evaluation   PLAN FOR NEXT SESSION: gentle GH mobility/stability, HEP review  Leeroy Cha PT, DPT 08/21/2022 3:59 PM

## 2022-08-21 ENCOUNTER — Ambulatory Visit: Payer: Medicaid Other | Attending: Surgical | Admitting: Physical Therapy

## 2022-08-21 ENCOUNTER — Encounter: Payer: Self-pay | Admitting: Physical Therapy

## 2022-08-21 DIAGNOSIS — M25612 Stiffness of left shoulder, not elsewhere classified: Secondary | ICD-10-CM | POA: Diagnosis present

## 2022-08-21 DIAGNOSIS — M6281 Muscle weakness (generalized): Secondary | ICD-10-CM | POA: Insufficient documentation

## 2022-08-21 DIAGNOSIS — M25512 Pain in left shoulder: Secondary | ICD-10-CM | POA: Diagnosis present

## 2022-08-22 NOTE — Therapy (Signed)
OUTPATIENT PHYSICAL THERAPY TREATMENT NOTE   Patient Name: Denise Hendrix MRN: [id number] DOB:Jul 20, 2000, 22 y.o., female Today's Date: 08/23/2022  PCP: Paschal Dopp, PA   REFERRING PROVIDER: Julieanne Cotton, PA-C  END OF SESSION:   PT End of Session - 08/23/22 1458     Visit Number 3    Number of Visits 17    Date for PT Re-Evaluation 10/09/22    Authorization Type [facility] [employer]    Authorization Time Period no auth first 12 visits    Authorization - Visit Number 3    Authorization - Number of Visits 12    PT Start Time 1459    PT Stop Time 1547   time not billed for heat   PT Time Calculation (min) 48 min    Activity Tolerance Patient limited by pain    Behavior During Therapy [facility] for tasks assessed/performed              History reviewed. No pertinent past medical history. History reviewed. No pertinent surgical history. There are no problems to display for this patient.   REFERRING DIAG: M25.512 (ICD-10-CM) - Left shoulder pain, unspecified chronicity   THERAPY DIAG:  Left shoulder pain, unspecified chronicity  Muscle weakness (generalized)  Stiffness of left shoulder, not elsewhere classified  Rationale for Evaluation and Treatment Rehabilitation  PERTINENT HISTORY: Unremarkable PMH   PRECAUTIONS: none  SUBJECTIVE:                                                                                                                                                                                      SUBJECTIVE STATEMENT:   Pt arrives w/ 7/10 pain at present, states she felt fine after last session but had increased pain after work yesterday. No other new updates  PAIN:  Are you having pain: 7/10  Location: L anterior/superior shoulder  How would you describe your pain? stabbing Best in past week: 0/10 Worst in past week: 7-8/10 usually eases within a few minutes, sometimes hours Aggravating factors: lifting, reaching overhead, laundry,  reaching into cabinet, sleeping, upper body dressing (AM more so than PM) Easing factors: heat, medication, ointment    OBJECTIVE: (objective measures completed at initial evaluation unless otherwise dated)   DIAGNOSTIC FINDINGS:  MRI per [facility] 07/26/22 IMPRESSION: Small nondisplaced tear of the posteroinferior labrum from 7:00-8:00.   Focal irregularity of the anterior superior labrum at 1:00 with undercutting contrast from 12:00-1:00, favored to represent a small additional nondisplaced tear, versus normal variant.   No evidence of rotator cuff tear.   PATIENT SURVEYS:  QuickDASH: 47.7%   COGNITION: Overall cognitive status: Within functional limits for tasks assessed  SENSATION: Light touch intact BUE    POSTURE: Forward head, rounded shoulders, elevated UT   UPPER EXTREMITY ROM:   A/PROM Right eval Left eval  Shoulder flexion 168 142 s *  Shoulder abduction 166 136 s *  Shoulder internal rotation (functional) T10 T10 *  Shoulder external rotation (functional) T1 C7 *  Elbow flexion      Elbow extension      Wrist flexion      Wrist extension       (Blank rows = not tested) (Key: WFL = within functional limits not formally assessed, * = concordant pain, s = stiffness/stretching sensation, NT = not tested)  Comments:     UPPER EXTREMITY MMT:   MMT Right eval Left eval  Shoulder flexion      Shoulder extension      Shoulder abduction      Shoulder extension      Shoulder internal rotation 5 3+ *  Shoulder external rotation 5 3+ *  Elbow flexion 5 3+ *  Elbow extension 5 3+ *  Grip strength      (Blank rows = not tested)  (Key: WFL = within functional limits not formally assessed, * = concordant pain, s = stiffness/stretching sensation, NT = not tested)  Comments:    SHOULDER SPECIAL TESTS: Deferred given symptom irritability w/ ROM/MMT   PALPATION:  Concordant TTP L bicipital groove, deltoid, and  infraspinatus/teres             TODAY'S TREATMENT:                                                                                               [facility] Adult PT Treatment:                                                DATE: 08/23/22 Therapeutic Exercise: LS stretch towards R 3x30sec hold  Supine ER AAROM w/ pillow prop 2x10 cues for appropriate ROM and setup Seated horizontal adduction stretch 3x30sec cues for form  Standing flexion walkbacks x8 cues for pacing Swiss ball scaption x8 cues for form and tolerable ROM Swiss ball flexion x8 cues for form and tolerable ROM Seated elbow flexion x8, supine elbow flexion 2x8 with pillow prep Sidelying ER w/ towel brace 2x8 cues for form and neutral positioning   Modalities: Seated, moist hot pack L shoulder, no adverse events, reports good relief (time not billed)                                      [facility] Adult PT Treatment:                                                DATE: 08/21/22 Therapeutic Exercise: Scapular retraction 2x10  cues for form and posture  Swiss ball flexion up wall 2x5 cues for form and comfortable ROM  Swiss ball RC perturbations x15sec, 2x30sec cues for appropriate  ER iso x5 at column w/ towel support cues for setup IR iso 2x5 at column w/ towel support cues for setup Counter flexion stretch walkbacks 2x8 cues for form and appropriate ROM HEP handout + education    PATIENT EDUCATION: Education details: Rationale for interventions, HEP review Person educated: Patient Education method: Explanation, Demonstration, Tactile cues, Verbal cues, handout Education comprehension: verbalized understanding, returned demonstration, verbal cues required, tactile cues required, and needs further education     HOME EXERCISE PROGRAM: Access Code: [email or web address] URL: [email or web address] Date: 08/21/2022 Prepared by: Enis Slipper  Exercises - Standing 'L' Stretch at Counter  - 1 x daily - 7 x weekly - 2 sets - 6 reps -  Seated Scapular Retraction  - 1 x daily - 7 x weekly - 2 sets - 10 reps   ASSESSMENT:   CLINICAL IMPRESSION: 08/23/2022 Pt arrives w/ report of 7/10 pain, no issues after last session but does endorse increased pain after more overhead reaching at work yesterday. Today focusing on maximizing ROM within tolerable range and pacing activities, mild increase in pain to 8/10 with activity. Pt requests moist heat after exercise, 6/10 pain on departure, no adverse events and reports good relief from moist heat. Continues to demo limitations in [facility] mobility/strength with high symptom irritability, recommend skilled PT to address. Pt departs today's session in no acute distress, all voiced questions/concerns addressed appropriately from PT perspective.    Eval - Patient is a pleasant 22 y.o. woman who was seen today for physical therapy evaluation and treatment for L shoulder pain since summer 2023, gradually worsening with exacerbation in Dec 2023. Pt states she has limitations in strength and mobility that are affecting her ability to perform ADLs and work activities, particular difficulty with reaching overhead and lifting heavy items. Symptoms are irritable on examination today but eases back to baseline after a few minutes - pt does demo globally reduced UE MMT (elbow flex/ext and [facility] ER most provocative) and stiffness with GH AROM. Anticipate pt would benefit from skilled PT to address these deficits in order to maximize functional independence/tolerance. No adverse events, pt denies any increase in pain on departure. Pt departs today's session in no acute distress, all voiced questions/concerns addressed appropriately from PT perspective.     OBJECTIVE IMPAIRMENTS: decreased activity tolerance, decreased endurance, decreased mobility, decreased ROM, decreased strength, hypomobility, impaired perceived functional ability, increased muscle spasms, impaired UE functional use, and pain.    ACTIVITY LIMITATIONS:  carrying, lifting, sleeping, dressing, reach over head, and hygiene/grooming   PARTICIPATION LIMITATIONS: cleaning, laundry, and occupation   PERSONAL FACTORS: Time since onset of injury/illness/exacerbation are also affecting patient's functional outcome.    REHAB POTENTIAL: Good   CLINICAL DECISION MAKING: Evolving/moderate complexity   EVALUATION COMPLEXITY: Low     GOALS: Goals reviewed with patient? No   SHORT TERM GOALS: Target date: 09/08/2022 Pt will demonstrate appropriate understanding and performance of initially prescribed HEP in order to facilitate improved independence with management of symptoms.  Baseline: HEP provided on eval Goal status: INITIAL    2. Pt will score less than or equal to 39% on Quick DASH in order to demonstrate improved perception of function due to symptoms.            Baseline: 47.7%  Goal status: INITIAL     LONG TERM GOALS: Target date: 10/06/2022 Pt will score less than or equal 30% on Quick DASH in order to demonstrate improved perception of function due to symptoms. Baseline: 47.7% Goal status: INITIAL   2.  Pt will demonstrate at least 160 degrees of active L shoulder elevation in order to demonstrate improved tolerance to reaching overhead.  Baseline: see ROM chart above Goal status: INITIAL   3.  Pt will demonstrate at least 4+/5 shoulder L ER/IR MMT for improved symmetry of UE strength and improved tolerance to functional movements.  Baseline: see MMT chart above Goal status: INITIAL   4. Pt will report/demonstrate ability to perform 30# floor<>waist lift with less than 2 point increase in pain on NPS in order to indicative improved tolerance/independence with work tasks.            Baseline: reports up to 8/10 pain with heavy tasks at work            Goal status: INITIAL    PLAN:   PT FREQUENCY: 2x/week   PT DURATION: 8 weeks   PLANNED INTERVENTIONS: Therapeutic exercises, Therapeutic activity, Neuromuscular  re-education, Balance training, Gait training, Patient/Family education, Self Care, Joint mobilization, Joint manipulation, Dry Needling, Electrical stimulation, Spinal manipulation, Spinal mobilization, Cryotherapy, Moist heat, Taping, Manual therapy, and Re-evaluation   PLAN FOR NEXT SESSION:  gentle [place] mobility/stability, HEP review/update  Leeroy Cha PT, DPT 08/23/2022 4:41 PM

## 2022-08-23 ENCOUNTER — Ambulatory Visit: Payer: Medicaid Other | Admitting: Physical Therapy

## 2022-08-23 ENCOUNTER — Encounter: Payer: Self-pay | Admitting: Physical Therapy

## 2022-08-23 DIAGNOSIS — M6281 Muscle weakness (generalized): Secondary | ICD-10-CM

## 2022-08-23 DIAGNOSIS — M25512 Pain in left shoulder: Secondary | ICD-10-CM

## 2022-08-23 DIAGNOSIS — M25612 Stiffness of left shoulder, not elsewhere classified: Secondary | ICD-10-CM

## 2022-09-01 NOTE — Therapy (Signed)
OUTPATIENT PHYSICAL THERAPY TREATMENT NOTE   Patient Name: Denise Hendrix MRN: [id number] DOB:[birth year], 22 y.o., female Today's Date: 09/04/2022  PCP: Milford Cage, PA   REFERRING PROVIDER: Donella Stade, PA-C  END OF SESSION:   PT End of Session - 09/04/22 1630     Visit Number 4    Number of Visits 17    Date for PT Re-Evaluation 10/09/22    Authorization Type [facility] [employer]    Authorization Time Period no auth first 12 visits    Authorization - Visit Number 4    Authorization - Number of Visits 12    PT Start Time 1630    PT Stop Time [id number]    PT Time Calculation (min) 45 min    Activity Tolerance Patient tolerated treatment well;No increased pain    Behavior During Therapy [facility] for tasks assessed/performed               History reviewed. No pertinent past medical history. History reviewed. No pertinent surgical history. There are no problems to display for this patient.   REFERRING DIAG: M25.512 (ICD-10-CM) - Left shoulder pain, unspecified chronicity   THERAPY DIAG:  Left shoulder pain, unspecified chronicity  Muscle weakness (generalized)  Stiffness of left shoulder, not elsewhere classified  Rationale for Evaluation and Treatment Rehabilitation  PERTINENT HISTORY: Unremarkable PMH   PRECAUTIONS: none  SUBJECTIVE:                                                                                                                                                                                      SUBJECTIVE STATEMENT:   Pt states her shoulder is actually feeling pretty good today. Felt a bit sore after last session but noticed after a couple days her baseline seemed to improve. Does note some increased low back pain and shoulder pain after lifting machines last week.     PAIN:  Are you having pain: 4/10  Location: L anterior/superior shoulder  How would you describe your pain? stabbing Best in past week: 0/10 Worst in past week:  7-8/10 usually eases within a few minutes, sometimes hours Aggravating factors: lifting, reaching overhead, laundry, reaching into cabinet, sleeping, upper body dressing (AM more so than PM) Easing factors: heat, medication, ointment    OBJECTIVE: (objective measures completed at initial evaluation unless otherwise dated)   DIAGNOSTIC FINDINGS:  MRI per [facility] 07/26/22 IMPRESSION: Small nondisplaced tear of the posteroinferior labrum from 7:00-8:00.   Focal irregularity of the anterior superior labrum at 1:00 with undercutting contrast from 12:00-1:00, favored to represent a small additional nondisplaced tear, versus normal variant.   No evidence of rotator cuff tear.  PATIENT SURVEYS:  QuickDASH: 47.7%   COGNITION: Overall cognitive status: Within functional limits for tasks assessed                                  SENSATION: Light touch intact BUE    POSTURE: Forward head, rounded shoulders, elevated UT   UPPER EXTREMITY ROM:   A/PROM Right eval Left eval  Shoulder flexion 168 142 s *  Shoulder abduction 166 136 s *  Shoulder internal rotation (functional) T10 T10 *  Shoulder external rotation (functional) T1 C7 *  Elbow flexion      Elbow extension      Wrist flexion      Wrist extension       (Blank rows = not tested) (Key: WFL = within functional limits not formally assessed, * = concordant pain, s = stiffness/stretching sensation, NT = not tested)  Comments:     UPPER EXTREMITY MMT:   MMT Right eval Left eval  Shoulder flexion      Shoulder extension      Shoulder abduction      Shoulder extension      Shoulder internal rotation 5 3+ *  Shoulder external rotation 5 3+ *  Elbow flexion 5 3+ *  Elbow extension 5 3+ *  Grip strength      (Blank rows = not tested)  (Key: WFL = within functional limits not formally assessed, * = concordant pain, s = stiffness/stretching sensation, NT = not tested)  Comments:    SHOULDER SPECIAL TESTS: Deferred given  symptom irritability w/ ROM/MMT   PALPATION:  Concordant TTP L bicipital groove, deltoid, and infraspinatus/teres             TODAY'S TREATMENT:                                                                                                [facility] Adult PT Treatment:                                                DATE: 09/04/22 Therapeutic Exercise: Modified counter plank 3x30sec cues for hip/shoulder positioning  RTB 3 way reach, propped on wall, 2x3 each UE  Sidelying ER 2x8 LUE cues for reduced compensations, appropriate ROM  2# bicep curl 2x8 supinated grip cues for posture and eccentric control  LUE abduction isometrics x10 at wall cues for posture LUE GH flexion isometrics x10  Shoulder flexion walk back x8   [facility] Adult PT Treatment:                                                DATE: 08/23/22 Therapeutic Exercise: LS stretch towards R 3x30sec hold  Supine ER AAROM w/ pillow prop 2x10 cues for appropriate ROM and setup  Seated horizontal adduction stretch 3x30sec cues for form  Standing flexion walkbacks x8 cues for pacing Swiss ball scaption x8 cues for form and tolerable ROM Swiss ball flexion x8 cues for form and tolerable ROM Seated elbow flexion x8, supine elbow flexion 2x8 with pillow prep Sidelying ER w/ towel brace 2x8 cues for form and neutral positioning   Modalities: Seated, moist hot pack L shoulder, no adverse events, reports good relief (time not billed)    PATIENT EDUCATION: Education details: Rationale for interventions, HEP review Person educated: Patient Education method: Explanation, Demonstration, Tactile cues, Verbal cues, handout Education comprehension: verbalized understanding, returned demonstration, verbal cues required, tactile cues required, and needs further education     HOME EXERCISE PROGRAM: Access Code: [email or web address] URL: [email or web address] Date: 08/21/2022 Prepared by: Enis Slipper  Exercises - Standing 'L' Stretch at Counter   - 1 x daily - 7 x weekly - 2 sets - 6 reps - Seated Scapular Retraction  - 1 x daily - 7 x weekly - 2 sets - 10 reps   ASSESSMENT:   CLINICAL IMPRESSION: 09/04/2022 Pt arrives w/ 4/10 pain, states she is feeling a bit better today although did have an exacerbation last week after lifting machinery at work. Pt demos improved tolerance to activity on this date, increased emphasis on closed chain stability/endurance and open chain control. No adverse events, pt reports muscular fatigue with activity but no increases in pain, reports feeling about the same on departure as on arrival. Pt departs today's session in no acute distress, all voiced questions/concerns addressed appropriately from PT perspective.     Eval - Patient is a pleasant 22 y.o. woman who was seen today for physical therapy evaluation and treatment for L shoulder pain since summer 2023, gradually worsening with exacerbation in Dec 2023. Pt states she has limitations in strength and mobility that are affecting her ability to perform ADLs and work activities, particular difficulty with reaching overhead and lifting heavy items. Symptoms are irritable on examination today but eases back to baseline after a few minutes - pt does demo globally reduced UE MMT (elbow flex/ext and [facility] ER most provocative) and stiffness with GH AROM. Anticipate pt would benefit from skilled PT to address these deficits in order to maximize functional independence/tolerance. No adverse events, pt denies any increase in pain on departure. Pt departs today's session in no acute distress, all voiced questions/concerns addressed appropriately from PT perspective.     OBJECTIVE IMPAIRMENTS: decreased activity tolerance, decreased endurance, decreased mobility, decreased ROM, decreased strength, hypomobility, impaired perceived functional ability, increased muscle spasms, impaired UE functional use, and pain.    ACTIVITY LIMITATIONS: carrying, lifting, sleeping, dressing,  reach over head, and hygiene/grooming   PARTICIPATION LIMITATIONS: cleaning, laundry, and occupation   PERSONAL FACTORS: Time since onset of injury/illness/exacerbation are also affecting patient's functional outcome.    REHAB POTENTIAL: Good   CLINICAL DECISION MAKING: Evolving/moderate complexity   EVALUATION COMPLEXITY: Low     GOALS: Goals reviewed with patient? No   SHORT TERM GOALS: Target date: 09/08/2022 Pt will demonstrate appropriate understanding and performance of initially prescribed HEP in order to facilitate improved independence with management of symptoms.  Baseline: HEP provided on eval Goal status: INITIAL    2. Pt will score less than or equal to 39% on Quick DASH in order to demonstrate improved perception of function due to symptoms.            Baseline: 47.7%  Goal status: INITIAL     LONG TERM GOALS: Target date: 10/06/2022 Pt will score less than or equal 30% on Quick DASH in order to demonstrate improved perception of function due to symptoms. Baseline: 47.7% Goal status: INITIAL   2.  Pt will demonstrate at least 160 degrees of active L shoulder elevation in order to demonstrate improved tolerance to reaching overhead.  Baseline: see ROM chart above Goal status: INITIAL   3.  Pt will demonstrate at least 4+/5 shoulder L ER/IR MMT for improved symmetry of UE strength and improved tolerance to functional movements.  Baseline: see MMT chart above Goal status: INITIAL   4. Pt will report/demonstrate ability to perform 30# floor<>waist lift with less than 2 point increase in pain on NPS in order to indicative improved tolerance/independence with work tasks.            Baseline: reports up to 8/10 pain with heavy tasks at work            Goal status: INITIAL    PLAN:   PT FREQUENCY: 2x/week   PT DURATION: 8 weeks   PLANNED INTERVENTIONS: Therapeutic exercises, Therapeutic activity, Neuromuscular re-education, Balance training, Gait  training, Patient/Family education, Self Care, Joint mobilization, Joint manipulation, Dry Needling, Electrical stimulation, Spinal manipulation, Spinal mobilization, Cryotherapy, Moist heat, Taping, Manual therapy, and Re-evaluation   PLAN FOR NEXT SESSION:  gentle Keys mobility/stability, HEP review/update    Leeroy Cha PT, DPT 09/04/2022 5:20 PM

## 2022-09-04 ENCOUNTER — Encounter: Payer: Self-pay | Admitting: Physical Therapy

## 2022-09-04 ENCOUNTER — Ambulatory Visit: Payer: Medicaid Other | Admitting: Physical Therapy

## 2022-09-04 DIAGNOSIS — M25512 Pain in left shoulder: Secondary | ICD-10-CM

## 2022-09-04 DIAGNOSIS — M6281 Muscle weakness (generalized): Secondary | ICD-10-CM

## 2022-09-04 DIAGNOSIS — M25612 Stiffness of left shoulder, not elsewhere classified: Secondary | ICD-10-CM

## 2022-09-05 NOTE — Therapy (Signed)
OUTPATIENT PHYSICAL THERAPY TREATMENT NOTE   Patient Name: Denise Hendrix MRN: [id number] DOB:[birth year], 22 y.o., female Today's Date: 09/06/2022  PCP: Milford Cage, PA   REFERRING PROVIDER: Donella Stade, PA-C  END OF SESSION:   PT End of Session - 09/06/22 1630     Visit Number 5    Number of Visits 17    Date for PT Re-Evaluation 10/09/22    Authorization Type [facility] [employer]    Authorization Time Period no auth first 12 visits    Authorization - Visit Number 5    Authorization - Number of Visits 12    PT Start Time 1630    PT Stop Time 1715    PT Time Calculation (min) 45 min    Activity Tolerance Patient tolerated treatment well    Behavior During Therapy [facility] for tasks assessed/performed                History reviewed. No pertinent past medical history. History reviewed. No pertinent surgical history. There are no problems to display for this patient.   REFERRING DIAG: M25.512 (ICD-10-CM) - Left shoulder pain, unspecified chronicity   THERAPY DIAG:  Left shoulder pain, unspecified chronicity  Muscle weakness (generalized)  Stiffness of left shoulder, not elsewhere classified  Rationale for Evaluation and Treatment Rehabilitation  PERTINENT HISTORY: Unremarkable PMH   PRECAUTIONS: none  SUBJECTIVE:                                                                                                                                                                                      SUBJECTIVE STATEMENT:   Pt states her shoulder felt good after last session, 3/10 at present. Back still bothering her some. Otherwise doing well, feels therapy has been helpful and she even tried lifting a box yesterday.     PAIN:  Are you having pain: 3/10  Location: L anterior/superior shoulder  How would you describe your pain? stabbing Best in past week: 0/10 Worst in past week: 7-8/10 usually eases within a few minutes, sometimes hours Aggravating  factors: lifting, reaching overhead, laundry, reaching into cabinet, sleeping, upper body dressing (AM more so than PM) Easing factors: heat, medication, ointment    OBJECTIVE: (objective measures completed at initial evaluation unless otherwise dated)   DIAGNOSTIC FINDINGS:  MRI per [facility] 07/26/22 IMPRESSION: Small nondisplaced tear of the posteroinferior labrum from 7:00-8:00.   Focal irregularity of the anterior superior labrum at 1:00 with undercutting contrast from 12:00-1:00, favored to represent a small additional nondisplaced tear, versus normal variant.   No evidence of rotator cuff tear.   PATIENT SURVEYS:  QuickDASH: 47.7%   COGNITION: Overall cognitive status:  Within functional limits for tasks assessed                                  SENSATION: Light touch intact BUE    POSTURE: Forward head, rounded shoulders, elevated UT   UPPER EXTREMITY ROM:   A/PROM Right eval Left eval L AROM 09/06/22  Shoulder flexion 168 142 s * 136 s  Shoulder abduction 166 136 s * 122 s  Shoulder internal rotation (functional) T10 T10 *   Shoulder external rotation (functional) T1 C7 *   Elbow flexion       Elbow extension       Wrist flexion       Wrist extension        (Blank rows = not tested) (Key: WFL = within functional limits not formally assessed, * = concordant pain, s = stiffness/stretching sensation, NT = not tested)  Comments:     UPPER EXTREMITY MMT:   MMT Right eval Left eval L 09/06/22  Shoulder flexion       Shoulder extension       Shoulder abduction       Shoulder extension       Shoulder internal rotation 5 3+ * 4 mild pain  Shoulder external rotation 5 3+ * 4 Mild pain   Elbow flexion 5 3+ *   Elbow extension 5 3+ *   Grip strength       (Blank rows = not tested)  (Key: WFL = within functional limits not formally assessed, * = concordant pain, s = stiffness/stretching sensation, NT = not tested)  Comments:    SHOULDER SPECIAL TESTS: Deferred  given symptom irritability w/ ROM/MMT   PALPATION:  Concordant TTP L bicipital groove, deltoid, and infraspinatus/teres             TODAY'S TREATMENT:                                                                                                [facility] Adult PT Treatment:                                                DATE: 09/06/22 Therapeutic Exercise: Modified plank at counter 2x45sec cues for setup and form Seated bicep curl 2# 3x8 cues for control and wrist positioning  Supine horizontal abd RTB 2x8 cues for positioning and appropriate ROM  Rhythmic stabilizations 3x30sec supine  L GH abduction iso at wall x10 w 2sec hold L GH flexion iso at wall x10 w 2 sec hold cues for positioning and reduced tricep compensation Shoulder flexion walkbacks at counter x12 cues for appropriate ROM  HEP update/review + handout   [facility] Adult PT Treatment:  DATE: 09/04/22 Therapeutic Exercise: Modified counter plank 3x30sec cues for hip/shoulder positioning  RTB 3 way reach, propped on wall, 2x3 each UE  Sidelying ER 2x8 LUE cues for reduced compensations, appropriate ROM  2# bicep curl 2x8 supinated grip cues for posture and eccentric control  LUE abduction isometrics x10 at wall cues for posture LUE GH flexion isometrics x10  Shoulder flexion walk back x8   [facility] Adult PT Treatment:                                                DATE: 08/23/22 Therapeutic Exercise: LS stretch towards R 3x30sec hold  Supine ER AAROM w/ pillow prop 2x10 cues for appropriate ROM and setup Seated horizontal adduction stretch 3x30sec cues for form  Standing flexion walkbacks x8 cues for pacing Swiss ball scaption x8 cues for form and tolerable ROM Swiss ball flexion x8 cues for form and tolerable ROM Seated elbow flexion x8, supine elbow flexion 2x8 with pillow prep Sidelying ER w/ towel brace 2x8 cues for form and neutral positioning   Modalities: Seated, moist hot pack L  shoulder, no adverse events, reports good relief (time not billed)    PATIENT EDUCATION: Education details: Rationale for interventions, HEP review/update Person educated: Patient Education method: Explanation, Demonstration, Tactile cues, Verbal cues, handout Education comprehension: verbalized understanding, returned demonstration, verbal cues required, tactile cues required, and needs further education     HOME EXERCISE PROGRAM: Access Code: [email or web address] URL: [email or web address] Date: 09/06/2022 Prepared by: Enis Slipper  Exercises - Standing 'L' Stretch at Counter  - 1 x daily - 7 x weekly - 2 sets - 6 reps - Supine Shoulder Horizontal Abduction with Resistance  - 1 x daily - 7 x weekly - 2 sets - 8 reps - Isometric Shoulder Abduction at Wall  - 1 x daily - 7 x weekly - 2 sets - 10 reps   ASSESSMENT:   CLINICAL IMPRESSION: 09/06/2022 Pt arrives w/ 3/10 pain, continues to do well overall, no issues after last session. Today pt able to progress familiar program with increased repetition/intensity where appropriate, cues as above. Continues w/ muscular fatigue. Noted improvement in MMT as above although mild reduction in AROM compared to initial evaluation which may be attributable to increased pain on arrival compared to eval. Gradual increase in pain with activity (up to 5/10) but improves with stretching at end of session (4/10). No adverse events, HEP updated as above and pt verbalizes good understanding of appropriate performance. Pt departs today's session in no acute distress, all voiced questions/concerns addressed appropriately from PT perspective.     Eval - Patient is a pleasant 22 y.o. woman who was seen today for physical therapy evaluation and treatment for L shoulder pain since summer 2023, gradually worsening with exacerbation in Dec 2023. Pt states she has limitations in strength and mobility that are affecting her ability to perform ADLs and work activities,  particular difficulty with reaching overhead and lifting heavy items. Symptoms are irritable on examination today but eases back to baseline after a few minutes - pt does demo globally reduced UE MMT (elbow flex/ext and [facility] ER most provocative) and stiffness with GH AROM. Anticipate pt would benefit from skilled PT to address these deficits in order to maximize functional independence/tolerance. No adverse events, pt denies any increase in pain on departure. Pt departs today's session in  no acute distress, all voiced questions/concerns addressed appropriately from PT perspective.     OBJECTIVE IMPAIRMENTS: decreased activity tolerance, decreased endurance, decreased mobility, decreased ROM, decreased strength, hypomobility, impaired perceived functional ability, increased muscle spasms, impaired UE functional use, and pain.    ACTIVITY LIMITATIONS: carrying, lifting, sleeping, dressing, reach over head, and hygiene/grooming   PARTICIPATION LIMITATIONS: cleaning, laundry, and occupation   PERSONAL FACTORS: Time since onset of injury/illness/exacerbation are also affecting patient's functional outcome.    REHAB POTENTIAL: Good   CLINICAL DECISION MAKING: Evolving/moderate complexity   EVALUATION COMPLEXITY: Low     GOALS: Goals reviewed with patient? No   SHORT TERM GOALS: Target date: 09/08/2022 Pt will demonstrate appropriate understanding and performance of initially prescribed HEP in order to facilitate improved independence with management of symptoms.  Baseline: HEP provided on eval 09/06/22: Pt endorses good performance of HEP at home Goal status: MET   2. Pt will score less than or equal to 39% on Quick DASH in order to demonstrate improved perception of function due to symptoms.            Baseline: 47.7%            Goal status: INITIAL     LONG TERM GOALS: Target date: 10/06/2022 Pt will score less than or equal 30% on Quick DASH in order to demonstrate improved perception of  function due to symptoms. Baseline: 47.7% Goal status: INITIAL   2.  Pt will demonstrate at least 160 degrees of active L shoulder elevation in order to demonstrate improved tolerance to reaching overhead.  Baseline: see ROM chart above Goal status: INITIAL   3.  Pt will demonstrate at least 4+/5 shoulder L ER/IR MMT for improved symmetry of UE strength and improved tolerance to functional movements.  Baseline: see MMT chart above Goal status: INITIAL   4. Pt will report/demonstrate ability to perform 30# floor<>waist lift with less than 2 point increase in pain on NPS in order to indicative improved tolerance/independence with work tasks.            Baseline: reports up to 8/10 pain with heavy tasks at work            Goal status: INITIAL    PLAN:   PT FREQUENCY: 2x/week   PT DURATION: 8 weeks   PLANNED INTERVENTIONS: Therapeutic exercises, Therapeutic activity, Neuromuscular re-education, Balance training, Gait training, Patient/Family education, Self Care, Joint mobilization, Joint manipulation, Dry Needling, Electrical stimulation, Spinal manipulation, Spinal mobilization, Cryotherapy, Moist heat, Taping, Manual therapy, and Re-evaluation   PLAN FOR NEXT SESSION:  gentle GH mobility/stability, HEP review/update  PRN  Leeroy Cha PT, DPT 09/06/2022 5:22 PM

## 2022-09-06 ENCOUNTER — Ambulatory Visit: Payer: Medicaid Other | Admitting: Physical Therapy

## 2022-09-06 ENCOUNTER — Encounter: Payer: Self-pay | Admitting: Physical Therapy

## 2022-09-06 DIAGNOSIS — M25512 Pain in left shoulder: Secondary | ICD-10-CM

## 2022-09-06 DIAGNOSIS — M25612 Stiffness of left shoulder, not elsewhere classified: Secondary | ICD-10-CM

## 2022-09-06 DIAGNOSIS — M6281 Muscle weakness (generalized): Secondary | ICD-10-CM

## 2022-09-08 NOTE — Therapy (Signed)
OUTPATIENT PHYSICAL THERAPY TREATMENT NOTE   Patient Name: Denise Hendrix MRN: [id number] DOB:Oct 06, 2000, 22 y.o., female Today's Date: 09/11/2022  PCP: Milford Cage, PA   REFERRING PROVIDER: Donella Stade, PA-C  END OF SESSION:   PT End of Session - 09/11/22 1459     Visit Number 6    Number of Visits 17    Date for PT Re-Evaluation 10/09/22    Authorization Type [facility] [employer]    Authorization Time Period no auth first 12 visits    Authorization - Visit Number 6    Authorization - Number of Visits 12    PT Start Time 1500    PT Stop Time 1545    PT Time Calculation (min) 45 min    Activity Tolerance Patient tolerated treatment well    Behavior During Therapy [facility] for tasks assessed/performed                 History reviewed. No pertinent past medical history. History reviewed. No pertinent surgical history. There are no problems to display for this patient.   REFERRING DIAG: M25.512 (ICD-10-CM) - Left shoulder pain, unspecified chronicity   THERAPY DIAG:  Left shoulder pain, unspecified chronicity  Muscle weakness (generalized)  Stiffness of left shoulder, not elsewhere classified  Rationale for Evaluation and Treatment Rehabilitation  PERTINENT HISTORY: Unremarkable PMH   PRECAUTIONS: none  SUBJECTIVE:                                                                                                                                                                                      SUBJECTIVE STATEMENT:   Pt arrives w/ 3/10 pain but states she worked today, overall her shoulder is feeling better. No issues after last session.    PAIN:  Are you having pain: 3/10  Location: L anterior/superior shoulder  How would you describe your pain? stabbing Best in past week: 0/10 Worst in past week: 6/10 (7-8/10 usually eases within a few minutes, sometimes hours) Aggravating factors: lifting, reaching overhead, laundry, reaching into cabinet,  sleeping, upper body dressing (AM more so than PM) Easing factors: heat, medication, ointment    OBJECTIVE: (objective measures completed at initial evaluation unless otherwise dated)   DIAGNOSTIC FINDINGS:  MRI per [facility] 07/26/22 IMPRESSION: Small nondisplaced tear of the posteroinferior labrum from 7:00-8:00.   Focal irregularity of the anterior superior labrum at 1:00 with undercutting contrast from 12:00-1:00, favored to represent a small additional nondisplaced tear, versus normal variant.   No evidence of rotator cuff tear.   PATIENT SURVEYS:  QuickDASH: 47.7% 09/11/22:  15.9%  COGNITION: Overall cognitive status: Within functional limits for tasks assessed  SENSATION: Light touch intact BUE    POSTURE: Forward head, rounded shoulders, elevated UT   UPPER EXTREMITY ROM:   A/PROM Right eval Left eval L AROM 09/06/22  Shoulder flexion 168 142 s * 136 s  Shoulder abduction 166 136 s * 122 s  Shoulder internal rotation (functional) T10 T10 *   Shoulder external rotation (functional) T1 C7 *   Elbow flexion       Elbow extension       Wrist flexion       Wrist extension        (Blank rows = not tested) (Key: WFL = within functional limits not formally assessed, * = concordant pain, s = stiffness/stretching sensation, NT = not tested)  Comments:     UPPER EXTREMITY MMT:   MMT Right eval Left eval L 09/06/22  Shoulder flexion       Shoulder extension       Shoulder abduction       Shoulder extension       Shoulder internal rotation 5 3+ * 4 mild pain  Shoulder external rotation 5 3+ * 4 Mild pain   Elbow flexion 5 3+ *   Elbow extension 5 3+ *   Grip strength       (Blank rows = not tested)  (Key: WFL = within functional limits not formally assessed, * = concordant pain, s = stiffness/stretching sensation, NT = not tested)  Comments:    SHOULDER SPECIAL TESTS: Deferred given symptom irritability w/ ROM/MMT   PALPATION:   Concordant TTP L bicipital groove, deltoid, and infraspinatus/teres             TODAY'S TREATMENT:                                                                                                [facility] Adult PT Treatment:                                                DATE: 09/11/22 Therapeutic Exercise: Counter plank 4x30sec cues for posture and form  Swiss ball flexion + push at wall 2x12 cues for form and reduced compensations at trunk  RC perturbations swiss ball at wall 90 deg 3x30sec cues for posture  3# hammer curl 2x10 cues for velocity/pacing   Therapeutic Activity: Quick DASH + education 5# KB front carry BIL UE 3x53f cues for posture and pacing  [facility] Adult PT Treatment:                                                DATE: 09/06/22 Therapeutic Exercise: Modified plank at counter 2x45sec cues for setup and form Seated bicep curl 2# 3x8 cues for control and wrist positioning  Supine horizontal abd RTB 2x8 cues for positioning and appropriate ROM  Rhythmic stabilizations 3x30sec supine  L  GH abduction iso at wall x10 w 2sec hold L GH flexion iso at wall x10 w 2 sec hold cues for positioning and reduced tricep compensation Shoulder flexion walkbacks at counter x12 cues for appropriate ROM  HEP update/review + handout   [facility] Adult PT Treatment:                                                DATE: 09/04/22 Therapeutic Exercise: Modified counter plank 3x30sec cues for hip/shoulder positioning  RTB 3 way reach, propped on wall, 2x3 each UE  Sidelying ER 2x8 LUE cues for reduced compensations, appropriate ROM  2# bicep curl 2x8 supinated grip cues for posture and eccentric control  LUE abduction isometrics x10 at wall cues for posture LUE GH flexion isometrics x10  Shoulder flexion walk back x8    PATIENT EDUCATION: Education details: Rationale for interventions, HEP review/update Person educated: Patient Education method: Explanation, Demonstration, Tactile cues, Verbal cues,  handout Education comprehension: verbalized understanding, returned demonstration, verbal cues required, tactile cues required, and needs further education     HOME EXERCISE PROGRAM: Access Code: [email or web address] URL: [email or web address] Date: 09/06/2022 Prepared by: Enis Slipper  Exercises - Standing 'L' Stretch at Counter  - 1 x daily - 7 x weekly - 2 sets - 6 reps - Supine Shoulder Horizontal Abduction with Resistance  - 1 x daily - 7 x weekly - 2 sets - 8 reps - Isometric Shoulder Abduction at Wall  - 1 x daily - 7 x weekly - 2 sets - 10 reps   ASSESSMENT:   CLINICAL IMPRESSION: 09/11/2022 Pt arrives w/ 3/10 pain and continues to report good progress overall, no issues after last session. Today focusing on progression open and closed chain shoulder stability at ~90 deg elevation as well as progression for shoulder/elbow strengthening. No adverse events, pt tolerates well although does require rest breaks due to fatigue. Pt departs today's session in no acute distress, all voiced questions/concerns addressed appropriately from PT perspective.     Eval - Patient is a pleasant 22 y.o. woman who was seen today for physical therapy evaluation and treatment for L shoulder pain since summer 2023, gradually worsening with exacerbation in Dec 2023. Pt states she has limitations in strength and mobility that are affecting her ability to perform ADLs and work activities, particular difficulty with reaching overhead and lifting heavy items. Symptoms are irritable on examination today but eases back to baseline after a few minutes - pt does demo globally reduced UE MMT (elbow flex/ext and [facility] ER most provocative) and stiffness with GH AROM. Anticipate pt would benefit from skilled PT to address these deficits in order to maximize functional independence/tolerance. No adverse events, pt denies any increase in pain on departure. Pt departs today's session in no acute distress, all voiced  questions/concerns addressed appropriately from PT perspective.     OBJECTIVE IMPAIRMENTS: decreased activity tolerance, decreased endurance, decreased mobility, decreased ROM, decreased strength, hypomobility, impaired perceived functional ability, increased muscle spasms, impaired UE functional use, and pain.    ACTIVITY LIMITATIONS: carrying, lifting, sleeping, dressing, reach over head, and hygiene/grooming   PARTICIPATION LIMITATIONS: cleaning, laundry, and occupation   PERSONAL FACTORS: Time since onset of injury/illness/exacerbation are also affecting patient's functional outcome.    REHAB POTENTIAL: Good   CLINICAL DECISION MAKING: Evolving/moderate complexity   EVALUATION COMPLEXITY: Low  GOALS: Goals reviewed with patient? No   SHORT TERM GOALS: Target date: 09/08/2022 Pt will demonstrate appropriate understanding and performance of initially prescribed HEP in order to facilitate improved independence with management of symptoms.  Baseline: HEP provided on eval 09/06/22: Pt endorses good performance of HEP at home Goal status: MET   2. Pt will score less than or equal to 39% on Quick DASH in order to demonstrate improved perception of function due to symptoms.            Baseline: 47.7%  09/11/22: 19%            Goal status: MET   LONG TERM GOALS: Target date: 10/06/2022 Pt will score less than or equal 30% on Quick DASH in order to demonstrate improved perception of function due to symptoms. Baseline: 47.7% Goal status: INITIAL   2.  Pt will demonstrate at least 160 degrees of active L shoulder elevation in order to demonstrate improved tolerance to reaching overhead.  Baseline: see ROM chart above Goal status: INITIAL   3.  Pt will demonstrate at least 4+/5 shoulder L ER/IR MMT for improved symmetry of UE strength and improved tolerance to functional movements.  Baseline: see MMT chart above Goal status: INITIAL   4. Pt will report/demonstrate ability to perform  30# floor<>waist lift with less than 2 point increase in pain on NPS in order to indicative improved tolerance/independence with work tasks.            Baseline: reports up to 8/10 pain with heavy tasks at work            Goal status: INITIAL    PLAN:   PT FREQUENCY: 2x/week   PT DURATION: 8 weeks   PLANNED INTERVENTIONS: Therapeutic exercises, Therapeutic activity, Neuromuscular re-education, Balance training, Gait training, Patient/Family education, Self Care, Joint mobilization, Joint manipulation, Dry Needling, Electrical stimulation, Spinal manipulation, Spinal mobilization, Cryotherapy, Moist heat, Taping, Manual therapy, and Re-evaluation   PLAN FOR NEXT SESSION:  gentle GH mobility/stability, HEP review/update  PRN   Leeroy Cha PT, DPT 09/11/2022 4:32 PM

## 2022-09-11 ENCOUNTER — Encounter: Payer: Self-pay | Admitting: Physical Therapy

## 2022-09-11 ENCOUNTER — Ambulatory Visit: Payer: Medicaid Other | Admitting: Physical Therapy

## 2022-09-11 DIAGNOSIS — M25612 Stiffness of left shoulder, not elsewhere classified: Secondary | ICD-10-CM

## 2022-09-11 DIAGNOSIS — M25512 Pain in left shoulder: Secondary | ICD-10-CM

## 2022-09-11 DIAGNOSIS — M6281 Muscle weakness (generalized): Secondary | ICD-10-CM

## 2022-09-19 NOTE — Therapy (Signed)
OUTPATIENT PHYSICAL THERAPY TREATMENT NOTE   Patient Name: Denise Hendrix MRN: [id number] DOB:05/28/2001, 22 y.o., female Today's Date: 09/20/2022  PCP: Milford Cage, PA   REFERRING PROVIDER: Donella Stade, PA-C  END OF SESSION:   PT End of Session - 09/20/22 1547     Visit Number 7    Number of Visits 17    Date for PT Re-Evaluation 10/09/22    Authorization Type [facility] [employer] [employer]    Authorization Time Period no auth first 12 visits    Authorization - Visit Number 7    Authorization - Number of Visits 12    PT Start Time [id number]    PT Stop Time 1629    PT Time Calculation (min) 42 min    Activity Tolerance Patient tolerated treatment well    Behavior During Therapy [facility] for tasks assessed/performed             History reviewed. No pertinent past medical history. History reviewed. No pertinent surgical history. There are no problems to display for this patient.   REFERRING DIAG: M25.512 (ICD-10-CM) - Left shoulder pain, unspecified chronicity   THERAPY DIAG:  Left shoulder pain, unspecified chronicity  Muscle weakness (generalized)  Stiffness of left shoulder, not elsewhere classified  Rationale for Evaluation and Treatment Rehabilitation  PERTINENT HISTORY: Unremarkable PMH   PRECAUTIONS: none  SUBJECTIVE:                                                                                                                                                                                      SUBJECTIVE STATEMENT:   Pt denies any issues after last session, 2/10 pain at present. Reports good compliance w/ HEP and has been trying to introduce exercises from sessions into home exercise as well  Next visit with MD: Mid April  PAIN:  Are you having pain: 2/10  Location: L anterior/superior shoulder  How would you describe your pain? stabbing Best in past week: 0/10 Worst in past week: 6/10 (7-8/10 usually eases within a few minutes, sometimes  hours) Aggravating factors: lifting, reaching overhead, laundry, reaching into cabinet, sleeping, upper body dressing (AM more so than PM) Easing factors: heat, medication, ointment    OBJECTIVE: (objective measures completed at initial evaluation unless otherwise dated)   DIAGNOSTIC FINDINGS:  MRI per [facility] 07/26/22 IMPRESSION: Small nondisplaced tear of the posteroinferior labrum from 7:00-8:00.   Focal irregularity of the anterior superior labrum at 1:00 with undercutting contrast from 12:00-1:00, favored to represent a small additional nondisplaced tear, versus normal variant.   No evidence of rotator cuff tear.   PATIENT SURVEYS:  QuickDASH: 47.7% 09/11/22:  15.9%  COGNITION: Overall cognitive  status: Within functional limits for tasks assessed                                  SENSATION: Light touch intact BUE    POSTURE: Forward head, rounded shoulders, elevated UT   UPPER EXTREMITY ROM:   A/PROM Right eval Left eval L AROM 09/06/22  Shoulder flexion 168 142 s * 136 s  Shoulder abduction 166 136 s * 122 s  Shoulder internal rotation (functional) T10 T10 *   Shoulder external rotation (functional) T1 C7 *   Elbow flexion       Elbow extension       Wrist flexion       Wrist extension        (Blank rows = not tested) (Key: WFL = within functional limits not formally assessed, * = concordant pain, s = stiffness/stretching sensation, NT = not tested)  Comments:     UPPER EXTREMITY MMT:   MMT Right eval Left eval L 09/06/22  Shoulder flexion       Shoulder extension       Shoulder abduction       Shoulder extension       Shoulder internal rotation 5 3+ * 4 mild pain  Shoulder external rotation 5 3+ * 4 Mild pain   Elbow flexion 5 3+ *   Elbow extension 5 3+ *   Grip strength       (Blank rows = not tested)  (Key: WFL = within functional limits not formally assessed, * = concordant pain, s = stiffness/stretching sensation, NT = not tested)  Comments:     SHOULDER SPECIAL TESTS: Deferred given symptom irritability w/ ROM/MMT   PALPATION:  Concordant TTP L bicipital groove, deltoid, and infraspinatus/teres             TODAY'S TREATMENT:                                                                                                [facility] Adult PT Treatment:                                                DATE: 09/20/22 Therapeutic Exercise: Counter planks 2x45sec  DB scaption unilat LUE 2# <90 deg 2x10 cues for form and pacing Seated bicep curl LUE 3# 2x8 cues for pacing and control Cable column tricep pull down LUE 3# 2x8 cues for neutral positioning and pacing  Cable column unilat mid row LUE 7# 2x10 cues for form  Flexion walkback at counter x12  HEP review + education   [facility] Adult PT Treatment:                                                DATE: 09/11/22 Therapeutic Exercise:  Counter plank 4x30sec cues for posture and form  Swiss ball flexion + push at wall 2x12 cues for form and reduced compensations at trunk  RC perturbations swiss ball at wall 90 deg 3x30sec cues for posture  3# hammer curl 2x10 cues for velocity/pacing   Therapeutic Activity: Quick DASH + education 5# KB front carry BIL UE 3x72f cues for posture and pacing  [facility]                                                [facility]: [facility] Therapeutic Exercise: Modified plank at counter 2x45sec cues for setup and form Seated bicep curl 2# 3x8 cues for control and wrist positioning  Supine horizontal abd RTB 2x8 cues for positioning and appropriate ROM  Rhythmic stabilizations 3x30sec supine  L GH abduction iso at wall x10 w 2sec hold L GH flexion iso at wall x10 w 2 sec hold cues for positioning and reduced tricep compensation Shoulder flexion walkbacks at counter x12 cues for appropriate ROM  HEP update/review + handout    PATIENT EDUCATION: Education details: Rationale for interventions, HEP review/update Person educated: Patient Education method:  Explanation, Demonstration, Tactile cues, Verbal cues, handout Education comprehension: verbalized understanding, returned demonstration, verbal cues required, tactile cues required, and needs further education     HOME EXERCISE PROGRAM: Access Code: [email or web address] URL: [email or web address] Date: 09/20/2022 Prepared by: DEnis Slipper Exercises - Standing 'L' Stretch at Counter  - 1 x daily - 7 x weekly - 2 sets - 6 reps - Supine Shoulder Horizontal Abduction with Resistance  - 1 x daily - 7 x weekly - 2 sets - 8 reps - Isometric Shoulder Abduction at Wall  - 1 x daily - 7 x weekly - 2 sets - 10 reps - Standing Shoulder Diagonal Horizontal Abduction 60/120 Degrees with Resistance  - 1 x daily - 7 x weekly - 2 sets - 8 reps   ASSESSMENT:   CLINICAL IMPRESSION: 09/20/2022 Pt arrives w/ 2/10 pain, continues to report steady progress. Today focusing on progression of familiar program for resistance, as well as addition of cable column exercises which pt tolerates well with cues for positioning and setup. No adverse events, pt reports mild transient increases in discomfort, 3/10 pain on departure. HEP updated as above. Recommend continuing along current POC in order to address relevant deficits and improve functional tolerance. Pt departs today's session in no acute distress, all voiced questions/concerns addressed appropriately from PT perspective.      Eval - Patient is a pleasant 22y.o. woman who was seen today for physical therapy evaluation and treatment for L shoulder pain since summer 2023, gradually worsening with exacerbation in Dec 2023. Pt states she has limitations in strength and mobility that are affecting her ability to perform ADLs and work activities, particular difficulty with reaching overhead and lifting heavy items. Symptoms are irritable on examination today but eases back to baseline after a few minutes - pt does demo globally reduced UE MMT ([facility]) and stiffness with GH AROM. Anticipate pt would benefit from skilled PT to address these deficits in order to maximize functional independence/tolerance. No adverse events, pt denies any increase in pain on departure. Pt departs today's session in no acute distress, all voiced questions/concerns addressed appropriately from PT perspective.     OBJECTIVE  IMPAIRMENTS: decreased activity tolerance, decreased endurance, decreased mobility, decreased ROM, decreased strength, hypomobility, impaired perceived functional ability, increased muscle spasms, impaired UE functional use, and pain.    ACTIVITY LIMITATIONS: carrying, lifting, sleeping, dressing, reach over head, and hygiene/grooming   PARTICIPATION LIMITATIONS: cleaning, laundry, and occupation   PERSONAL FACTORS: Time since onset of injury/illness/exacerbation are also affecting patient's functional outcome.    REHAB POTENTIAL: Good   CLINICAL DECISION MAKING: Evolving/moderate complexity   EVALUATION COMPLEXITY: Low     GOALS: Goals reviewed with patient? No   SHORT TERM GOALS: Target date: 09/08/2022 Pt will demonstrate appropriate understanding and performance of initially prescribed HEP in order to facilitate improved independence with management of symptoms.  Baseline: HEP provided on eval 09/06/22: Pt endorses good performance of HEP at home Goal status: MET   2. Pt will score less than or equal to 39% on Quick DASH in order to demonstrate improved perception of function due to symptoms.            Baseline: 47.7%  09/11/22: 19%            Goal status: MET   LONG TERM GOALS: Target date: 10/06/2022 Pt will score less than or equal 30% on Quick DASH in order to demonstrate improved perception of function due to symptoms. Baseline: 47.7% Goal status: INITIAL   2.  Pt will demonstrate at least 160 degrees of active L shoulder elevation in order to demonstrate improved tolerance to reaching overhead.  Baseline:  see ROM chart above Goal status: INITIAL   3.  Pt will demonstrate at least 4+/5 shoulder L ER/IR MMT for improved symmetry of UE strength and improved tolerance to functional movements.  Baseline: see MMT chart above Goal status: INITIAL   4. Pt will report/demonstrate ability to perform 30# floor<>waist lift with less than 2 point increase in pain on NPS in order to indicative improved tolerance/independence with work tasks.            Baseline: reports up to 8/10 pain with heavy tasks at work            Goal status: INITIAL    PLAN:   PT FREQUENCY: 2x/week   PT DURATION: 8 weeks   PLANNED INTERVENTIONS: Therapeutic exercises, Therapeutic activity, Neuromuscular re-education, Balance training, Gait training, Patient/Family education, Self Care, Joint mobilization, Joint manipulation, Dry Needling, Electrical stimulation, Spinal manipulation, Spinal mobilization, Cryotherapy, Moist heat, Taping, Manual therapy, and Re-evaluation   PLAN FOR NEXT SESSION:  gentle GH mobility/stability, HEP review/update  PRN    Leeroy Cha PT, DPT 09/20/2022 5:36 PM

## 2022-09-20 ENCOUNTER — Ambulatory Visit: Payer: Medicaid Other | Attending: Surgical | Admitting: Physical Therapy

## 2022-09-20 ENCOUNTER — Encounter: Payer: Self-pay | Admitting: Physical Therapy

## 2022-09-20 DIAGNOSIS — M25512 Pain in left shoulder: Secondary | ICD-10-CM | POA: Diagnosis present

## 2022-09-20 DIAGNOSIS — M25612 Stiffness of left shoulder, not elsewhere classified: Secondary | ICD-10-CM | POA: Diagnosis present

## 2022-09-20 DIAGNOSIS — M6281 Muscle weakness (generalized): Secondary | ICD-10-CM | POA: Diagnosis present

## 2022-09-25 ENCOUNTER — Ambulatory Visit: Payer: Medicaid Other | Admitting: Physical Therapy

## 2022-09-25 ENCOUNTER — Encounter: Payer: Self-pay | Admitting: Physical Therapy

## 2022-09-25 DIAGNOSIS — M25612 Stiffness of left shoulder, not elsewhere classified: Secondary | ICD-10-CM

## 2022-09-25 DIAGNOSIS — M25512 Pain in left shoulder: Secondary | ICD-10-CM

## 2022-09-25 DIAGNOSIS — M6281 Muscle weakness (generalized): Secondary | ICD-10-CM

## 2022-09-25 NOTE — Therapy (Signed)
OUTPATIENT PHYSICAL THERAPY TREATMENT NOTE   Patient Name: Denise Hendrix MRN: [id number] DOB:[birth year], 22 y.o., female Today's Date: 09/25/2022  PCP: Milford Cage, PA   REFERRING PROVIDER: Donella Stade, PA-C  END OF SESSION:   PT End of Session - 09/25/22 1451     Visit Number 8    Number of Visits 17    Date for PT Re-Evaluation 10/09/22    Authorization Type [facility] [employer] [employer]    Authorization Time Period no auth first 12 visits    Authorization - Visit Number 8    Authorization - Number of Visits 12    PT Start Time 0250    PT Stop Time 0323    PT Time Calculation (min) 33 min             History reviewed. No pertinent past medical history. History reviewed. No pertinent surgical history. There are no problems to display for this patient.   REFERRING DIAG: M25.512 (ICD-10-CM) - Left shoulder pain, unspecified chronicity   THERAPY DIAG:  Left shoulder pain, unspecified chronicity  Muscle weakness (generalized)  Stiffness of left shoulder, not elsewhere classified  Rationale for Evaluation and Treatment Rehabilitation  PERTINENT HISTORY: Unremarkable PMH   PRECAUTIONS: none  SUBJECTIVE:                                                                                                                                                                                      SUBJECTIVE STATEMENT:   Pt denies any issues after last session, 2/10 pain at present. Reports good compliance w/ HEP. Next visit with MD: Mid April  PAIN:  Are you having pain: 2/10  Location: L anterior/superior shoulder  How would you describe your pain? stabbing Best in past week: 0/10 Worst in past week: 6/10 (7-8/10 usually eases within a few minutes, sometimes hours) Aggravating factors: lifting, reaching overhead, laundry, reaching into cabinet, sleeping, upper body dressing (AM more so than PM) Easing factors: heat, medication, ointment    OBJECTIVE: (objective  measures completed at initial evaluation unless otherwise dated)   DIAGNOSTIC FINDINGS:  MRI per [facility] 07/26/22 IMPRESSION: Small nondisplaced tear of the posteroinferior labrum from 7:00-8:00.   Focal irregularity of the anterior superior labrum at 1:00 with undercutting contrast from 12:00-1:00, favored to represent a small additional nondisplaced tear, versus normal variant.   No evidence of rotator cuff tear.   PATIENT SURVEYS:  QuickDASH: 47.7% 09/11/22:  15.9%  COGNITION: Overall cognitive status: Within functional limits for tasks assessed  SENSATION: Light touch intact BUE    POSTURE: Forward head, rounded shoulders, elevated UT   UPPER EXTREMITY ROM:   A/PROM Right eval Left eval L AROM 09/06/22 L AROM  09/25/22  Shoulder flexion 168 142 s * 136 s 150  Shoulder abduction 166 136 s * 122 s 135  Shoulder internal rotation (functional) T10 T10 *    Shoulder external rotation (functional) T1 C7 *  T2  Elbow flexion        Elbow extension        Wrist flexion        Wrist extension         (Blank rows = not tested) (Key: WFL = within functional limits not formally assessed, * = concordant pain, s = stiffness/stretching sensation, NT = not tested)  Comments:     UPPER EXTREMITY MMT:   MMT Right eval Left eval L 09/06/22  Shoulder flexion       Shoulder extension       Shoulder abduction       Shoulder extension       Shoulder internal rotation 5 3+ * 4 mild pain  Shoulder external rotation 5 3+ * 4 Mild pain   Elbow flexion 5 3+ *   Elbow extension 5 3+ *   Grip strength       (Blank rows = not tested)  (Key: WFL = within functional limits not formally assessed, * = concordant pain, s = stiffness/stretching sensation, NT = not tested)  Comments:    SHOULDER SPECIAL TESTS: Deferred given symptom irritability w/ ROM/MMT   PALPATION:  Concordant TTP L bicipital groove, deltoid, and infraspinatus/teres             TODAY'S  TREATMENT:                                                                                                [facility] Adult PT Treatment:                                                DATE: 09/25/22 Therapeutic Exercise: UBE Level 1 2 min each way  Plank at counter - attempted gentle weight shift, unable , pain Flexion walkback at counter x12  Hammer curl 3# x 8 -pain 4/10 Bicep curl 3# x 8  Red band horizontal abduction x 8- mini rom  S/L abduction x 8 S/L ER AROM x 8 Seated unilat  scaption 2# <90 deg x 8  Seated tricp ext red band x10 Modalities:  HMP x 10 min     [facility] Adult PT Treatment:                                                DATE: 09/20/22 Therapeutic Exercise: Counter planks 2x45sec  DB scaption unilat LUE 2# <90 deg 2x10 cues  for form and pacing Seated bicep curl LUE 3# 2x8 cues for pacing and control Cable column tricep pull down LUE 3# 2x8 cues for neutral positioning and pacing  Cable column unilat mid row LUE 7# 2x10 cues for form  Flexion walkback at counter x12  HEP review + education   [facility] Adult PT Treatment:                                                DATE: 09/11/22 Therapeutic Exercise: Counter plank 4x30sec cues for posture and form  Swiss ball flexion + push at wall 2x12 cues for form and reduced compensations at trunk  RC perturbations swiss ball at wall 90 deg 3x30sec cues for posture  3# hammer curl 2x10 cues for velocity/pacing   Therapeutic Activity: Quick DASH + education 5# KB front carry BIL UE 3x23f cues for posture and pacing  [facility]                                                DATE: 09/[facility]/22 Therapeutic Exercise: Modified plank at counter 2x45sec cues for setup and form Seated bicep curl 2# 3x8 cues for control and wrist positioning  Supine horizontal abd RTB 2x8 cues for positioning and appropriate ROM  Rhythmic stabilizations 3x30sec supine  L GH abduction iso at wall x10 w 2sec hold L GH flexion iso at wall x10 w 2  sec hold cues for positioning and reduced tricep compensation Shoulder flexion walkbacks at counter x12 cues for appropriate ROM  HEP update/review + handout    PATIENT EDUCATION: Education details: Rationale for interventions, HEP review/update Person educated: Patient Education method: Explanation, Demonstration, Tactile cues, Verbal cues, handout Education comprehension: verbalized understanding, returned demonstration, verbal cues required, tactile cues required, and needs further education     HOME EXERCISE PROGRAM: Access Code: [email or web address] URL: [email or web address] Date: 09/20/2022 Prepared by: DEnis Slipper Exercises - Standing 'L' Stretch at Counter  - 1 x daily - 7 x weekly - 2 sets - 6 reps - Supine Shoulder Horizontal Abduction with Resistance  - 1 x daily - 7 x weekly - 2 sets - 8 reps - Isometric Shoulder Abduction at Wall  - 1 x daily - 7 x weekly - 2 sets - 10 reps - Standing Shoulder Diagonal Horizontal Abduction 60/120 Degrees with Resistance  - 1 x daily - 7 x weekly - 2 sets - 8 reps   ASSESSMENT:   CLINICAL IMPRESSION: 09/25/2022 Pt arrives w/ 2/10 pain, continues to report steady progress. Today she is easily irritated and attributes it to possibly lifting something at work before she came to her session. Her shoulder flexion and abduction have improved. Worked in side lying for AROM which was tolerated well today and reduces tendon in her shoulder. HMP applied at end of session to calm down pain.     Eval - Patient is a pleasant 22y.o. woman who was seen today for physical therapy evaluation and treatment for L shoulder pain since summer 2023, gradually worsening with exacerbation in Dec 2023. Pt states she has limitations in strength and mobility that are affecting her ability to perform ADLs and work activities, particular difficulty with reaching overhead and lifting heavy  items. Symptoms are irritable on examination today but eases back to baseline  after a few minutes - pt does demo globally reduced UE MMT (elbow flex/ext and [facility] ER most provocative) and stiffness with GH AROM. Anticipate pt would benefit from skilled PT to address these deficits in order to maximize functional independence/tolerance. No adverse events, pt denies any increase in pain on departure. Pt departs today's session in no acute distress, all voiced questions/concerns addressed appropriately from PT perspective.     OBJECTIVE IMPAIRMENTS: decreased activity tolerance, decreased endurance, decreased mobility, decreased ROM, decreased strength, hypomobility, impaired perceived functional ability, increased muscle spasms, impaired UE functional use, and pain.    ACTIVITY LIMITATIONS: carrying, lifting, sleeping, dressing, reach over head, and hygiene/grooming   PARTICIPATION LIMITATIONS: cleaning, laundry, and occupation   PERSONAL FACTORS: Time since onset of injury/illness/exacerbation are also affecting patient's functional outcome.    REHAB POTENTIAL: Good   CLINICAL DECISION MAKING: Evolving/moderate complexity   EVALUATION COMPLEXITY: Low     GOALS: Goals reviewed with patient? No   SHORT TERM GOALS: Target date: 09/08/2022 Pt will demonstrate appropriate understanding and performance of initially prescribed HEP in order to facilitate improved independence with management of symptoms.  Baseline: HEP provided on eval 09/06/22: Pt endorses good performance of HEP at home Goal status: MET   2. Pt will score less than or equal to 39% on Quick DASH in order to demonstrate improved perception of function due to symptoms.            Baseline: 47.7%  09/11/22: 19%            Goal status: MET   LONG TERM GOALS: Target date: 10/06/2022 Pt will score less than or equal 30% on Quick DASH in order to demonstrate improved perception of function due to symptoms. Baseline: 47.7% Goal status: INITIAL   2.  Pt will demonstrate at least 160 degrees of active L shoulder  elevation in order to demonstrate improved tolerance to reaching overhead.  Baseline: see ROM chart above Goal status: INITIAL   3.  Pt will demonstrate at least 4+/5 shoulder L ER/IR MMT for improved symmetry of UE strength and improved tolerance to functional movements.  Baseline: see MMT chart above Goal status: INITIAL   4. Pt will report/demonstrate ability to perform 30# floor<>waist lift with less than 2 point increase in pain on NPS in order to indicative improved tolerance/independence with work tasks.            Baseline: reports up to 8/10 pain with heavy tasks at work            Goal status: INITIAL    PLAN:   PT FREQUENCY: 2x/week   PT DURATION: 8 weeks   PLANNED INTERVENTIONS: Therapeutic exercises, Therapeutic activity, Neuromuscular re-education, Balance training, Gait training, Patient/Family education, Self Care, Joint mobilization, Joint manipulation, Dry Needling, Electrical stimulation, Spinal manipulation, Spinal mobilization, Cryotherapy, Moist heat, Taping, Manual therapy, and Re-evaluation   PLAN FOR NEXT SESSION:  gentle [place] mobility/stability, HEP review/update  PRN    Hessie Diener, PTA 09/25/22 3:28 PM Phone: [phone] Fax: [phone]

## 2022-09-26 NOTE — Therapy (Signed)
OUTPATIENT PHYSICAL THERAPY TREATMENT NOTE   Patient Name: Denise Hendrix MRN: [id number] DOB:24-Jul-2000, 22 y.o., female Today's Date: 09/27/2022  PCP: Milford Cage, PA   REFERRING PROVIDER: Donella Stade, PA-C  END OF SESSION:   PT End of Session - 09/27/22 1635     Visit Number 9    Number of Visits 17    Date for PT Re-Evaluation 10/09/22    Authorization Type [facility] [employer] [employer]    Authorization Time Period no auth first 12 visits    Authorization - Visit Number 9    Authorization - Number of Visits 12    PT Start Time [id number]    PT Stop Time [id number]    PT Time Calculation (min) 39 min    Activity Tolerance Patient tolerated treatment well;No increased pain    Behavior During Therapy [facility] for tasks assessed/performed              History reviewed. No pertinent past medical history. History reviewed. No pertinent surgical history. There are no problems to display for this patient.   REFERRING DIAG: M25.512 (ICD-10-CM) - Left shoulder pain, unspecified chronicity   THERAPY DIAG:  Left shoulder pain, unspecified chronicity  Muscle weakness (generalized)  Stiffness of left shoulder, not elsewhere classified  Rationale for Evaluation and Treatment Rehabilitation  PERTINENT HISTORY: Unremarkable PMH   PRECAUTIONS: none  SUBJECTIVE:                                                                                                                                                                                      SUBJECTIVE STATEMENT:   Pt states she has had some increased pain since Monday. Pt arrives w/ 4-5/10 pain on NPS at present, some improvement since exacerbation   PAIN:  Are you having pain: 4-5/10  Location: L anterior/superior shoulder  How would you describe your pain? stabbing Best in past week: 0/10 Worst in past week: 6/10 (7-8/10 usually eases within a few minutes, sometimes hours) Aggravating factors: lifting, reaching overhead,  laundry, reaching into cabinet, sleeping, upper body dressing (AM more so than PM) Easing factors: heat, medication, ointment    OBJECTIVE: (objective measures completed at initial evaluation unless otherwise dated)   DIAGNOSTIC FINDINGS:  MRI per [facility] 07/26/22 IMPRESSION: Small nondisplaced tear of the posteroinferior labrum from 7:00-8:00.   Focal irregularity of the anterior superior labrum at 1:00 with undercutting contrast from 12:00-1:00, favored to represent a small additional nondisplaced tear, versus normal variant.   No evidence of rotator cuff tear.   PATIENT SURVEYS:  QuickDASH: 47.7% 09/11/22:  15.9%  COGNITION: Overall cognitive status: Within functional limits for tasks assessed  SENSATION: Light touch intact BUE    POSTURE: Forward head, rounded shoulders, elevated UT   UPPER EXTREMITY ROM:   A/PROM Right eval Left eval L AROM 09/06/22 L AROM  09/25/22  Shoulder flexion 168 142 s * 136 s 150  Shoulder abduction 166 136 s * 122 s 135  Shoulder internal rotation (functional) T10 T10 *    Shoulder external rotation (functional) T1 C7 *  T2  Elbow flexion        Elbow extension        Wrist flexion        Wrist extension         (Blank rows = not tested) (Key: WFL = within functional limits not formally assessed, * = concordant pain, s = stiffness/stretching sensation, NT = not tested)  Comments:     UPPER EXTREMITY MMT:   MMT Right eval Left eval L 09/06/22  Shoulder flexion       Shoulder extension       Shoulder abduction       Shoulder extension       Shoulder internal rotation 5 3+ * 4 mild pain  Shoulder external rotation 5 3+ * 4 Mild pain   Elbow flexion 5 3+ *   Elbow extension 5 3+ *   Grip strength       (Blank rows = not tested)  (Key: WFL = within functional limits not formally assessed, * = concordant pain, s = stiffness/stretching sensation, NT = not tested)  Comments:    SHOULDER SPECIAL  TESTS: Deferred given symptom irritability w/ ROM/MMT   PALPATION:  Concordant TTP L bicipital groove, deltoid, and infraspinatus/teres             TODAY'S TREATMENT:                                                                                              [facility] Adult PT Treatment:                                                DATE: 09/27/22 Therapeutic Exercise: Shoulder flexion walkback 2x10 cues for comfortable ROM and pacing RTB shoulder ext 3x8 cues for form and posture  Standing RTB tricep push down 2x8 LUE only cues for positioning and trunk angle Swiss ball rollout at table, standing, 2x10 cues for appropriate ROM  Sidelying ER AROM 2x8 L ER, second round w/ towel roll for positioning   [facility] Adult PT Treatment:                                                DATE: 09/25/22 Therapeutic Exercise: UBE Level 1 2 min each way  Plank at counter - attempted gentle weight shift, unable , pain Flexion walkback at counter x12  Hammer curl 3# x 8 -pain 4/10 Bicep curl 3# x  8  Red band horizontal abduction x 8- mini rom  S/L abduction x 8 S/L ER AROM x 8 Seated unilat  scaption 2# <90 deg x 8  Seated tricp ext red band x10 Modalities:  HMP x 10 min     [facility] Adult PT Treatment:                                                DATE: 09/20/22 Therapeutic Exercise: Counter planks 2x45sec  DB scaption unilat LUE 2# <90 deg 2x10 cues for form and pacing Seated bicep curl LUE 3# 2x8 cues for pacing and control Cable column tricep pull down LUE 3# 2x8 cues for neutral positioning and pacing  Cable column unilat mid row LUE 7# 2x10 cues for form  Flexion walkback at counter x12  HEP review + education   PATIENT EDUCATION: Education details: Rationale for interventions, HEP review/update Person educated: Patient Education method: Consulting civil engineer, Demonstration, Corporate treasurer cues, Verbal cues, handout Education comprehension: verbalized understanding, returned demonstration, verbal cues required,  tactile cues required, and needs further education     HOME EXERCISE PROGRAM: Access Code: [email or web address] URL: [email or web address] Date: 09/20/2022 Prepared by: Enis Slipper  Exercises - Standing 'L' Stretch at Counter  - 1 x daily - 7 x weekly - 2 sets - 6 reps - Supine Shoulder Horizontal Abduction with Resistance  - 1 x daily - 7 x weekly - 2 sets - 8 reps - Isometric Shoulder Abduction at Wall  - 1 x daily - 7 x weekly - 2 sets - 10 reps - Standing Shoulder Diagonal Horizontal Abduction 60/120 Degrees with Resistance  - 1 x daily - 7 x weekly - 2 sets - 8 reps   ASSESSMENT:   CLINICAL IMPRESSION: 09/27/2022 Pt arrives w/ 4-5/10 pain on NPS, reports increased symptoms since Monday although have improved some. Given increased symptom irritability/pain levels, regressed today's program to allow for increased rest breaks and reduced direct biceps strain as elbow flexion/shoulder flexion most irritable. Cues as above, primary report of muscular fatigue. No adverse events, pt endorses no overt change in pain on departure. Pt departs today's session in no acute distress, all voiced questions/concerns addressed appropriately from PT perspective.    Eval - Patient is a pleasant 22 y.o. woman who was seen today for physical therapy evaluation and treatment for L shoulder pain since summer 2023, gradually worsening with exacerbation in Dec 2023. Pt states she has limitations in strength and mobility that are affecting her ability to perform ADLs and work activities, particular difficulty with reaching overhead and lifting heavy items. Symptoms are irritable on examination today but eases back to baseline after a few minutes - pt does demo globally reduced UE MMT (elbow flex/ext and [facility] ER most provocative) and stiffness with GH AROM. Anticipate pt would benefit from skilled PT to address these deficits in order to maximize functional independence/tolerance. No adverse events, pt denies any  increase in pain on departure. Pt departs today's session in no acute distress, all voiced questions/concerns addressed appropriately from PT perspective.     OBJECTIVE IMPAIRMENTS: decreased activity tolerance, decreased endurance, decreased mobility, decreased ROM, decreased strength, hypomobility, impaired perceived functional ability, increased muscle spasms, impaired UE functional use, and pain.    ACTIVITY LIMITATIONS: carrying, lifting, sleeping, dressing, reach over head, and hygiene/grooming   PARTICIPATION LIMITATIONS: cleaning, laundry, and occupation  PERSONAL FACTORS: Time since onset of injury/illness/exacerbation are also affecting patient's functional outcome.    REHAB POTENTIAL: Good   CLINICAL DECISION MAKING: Evolving/moderate complexity   EVALUATION COMPLEXITY: Low     GOALS: Goals reviewed with patient? No   SHORT TERM GOALS: Target date: 09/08/2022 Pt will demonstrate appropriate understanding and performance of initially prescribed HEP in order to facilitate improved independence with management of symptoms.  Baseline: HEP provided on eval 09/06/22: Pt endorses good performance of HEP at home Goal status: MET   2. Pt will score less than or equal to 39% on Quick DASH in order to demonstrate improved perception of function due to symptoms.            Baseline: 47.7%  09/11/22: 19%            Goal status: MET   LONG TERM GOALS: Target date: 10/06/2022 Pt will score less than or equal 30% on Quick DASH in order to demonstrate improved perception of function due to symptoms. Baseline: 47.7% Goal status: INITIAL   2.  Pt will demonstrate at least 160 degrees of active L shoulder elevation in order to demonstrate improved tolerance to reaching overhead.  Baseline: see ROM chart above Goal status: INITIAL   3.  Pt will demonstrate at least 4+/5 shoulder L ER/IR MMT for improved symmetry of UE strength and improved tolerance to functional movements.  Baseline: see  MMT chart above Goal status: INITIAL   4. Pt will report/demonstrate ability to perform 30# floor<>waist lift with less than 2 point increase in pain on NPS in order to indicative improved tolerance/independence with work tasks.            Baseline: reports up to 8/10 pain with heavy tasks at work            Goal status: INITIAL    PLAN:   PT FREQUENCY: 2x/week   PT DURATION: 8 weeks   PLANNED INTERVENTIONS: Therapeutic exercises, Therapeutic activity, Neuromuscular re-education, Balance training, Gait training, Patient/Family education, Self Care, Joint mobilization, Joint manipulation, Dry Needling, Electrical stimulation, Spinal manipulation, Spinal mobilization, Cryotherapy, Moist heat, Taping, Manual therapy, and Re-evaluation   PLAN FOR NEXT SESSION:  gentle [place] mobility/stability, HEP review/update PRN    Leeroy Cha PT, DPT 09/27/2022 5:25 PM

## 2022-09-27 ENCOUNTER — Encounter: Payer: Self-pay | Admitting: Physical Therapy

## 2022-09-27 ENCOUNTER — Ambulatory Visit: Payer: Medicaid Other | Admitting: Physical Therapy

## 2022-09-27 DIAGNOSIS — M6281 Muscle weakness (generalized): Secondary | ICD-10-CM

## 2022-09-27 DIAGNOSIS — M25612 Stiffness of left shoulder, not elsewhere classified: Secondary | ICD-10-CM

## 2022-09-27 DIAGNOSIS — M25512 Pain in left shoulder: Secondary | ICD-10-CM

## 2022-10-02 ENCOUNTER — Ambulatory Visit: Payer: Medicaid Other | Admitting: Physical Therapy

## 2022-10-04 ENCOUNTER — Encounter: Payer: Self-pay | Admitting: Physical Therapy

## 2022-10-04 ENCOUNTER — Ambulatory Visit: Payer: Medicaid Other | Admitting: Physical Therapy

## 2022-10-04 DIAGNOSIS — M25512 Pain in left shoulder: Secondary | ICD-10-CM | POA: Diagnosis not present

## 2022-10-04 DIAGNOSIS — M6281 Muscle weakness (generalized): Secondary | ICD-10-CM

## 2022-10-04 DIAGNOSIS — M25612 Stiffness of left shoulder, not elsewhere classified: Secondary | ICD-10-CM

## 2022-10-04 NOTE — Therapy (Signed)
OUTPATIENT PHYSICAL THERAPY TREATMENT NOTE   Patient Name: Denise Hendrix MRN: [id number] DOB:12/01/2000, 22 y.o., female Today's Date: 10/04/2022  PCP: Milford Cage, PA   REFERRING PROVIDER: Donella Stade, PA-C  END OF SESSION:   PT End of Session - 10/04/22 1449     Visit Number 10    Number of Visits 17    Date for PT Re-Evaluation 10/09/22    Authorization Type [facility] [employer]    Authorization Time Period no auth first 12 visits    Authorization - Visit Number 10    Authorization - Number of Visits 12    PT Start Time 0248    PT Stop Time 0326    PT Time Calculation (min) 38 min              History reviewed. No pertinent past medical history. History reviewed. No pertinent surgical history. There are no problems to display for this patient.   REFERRING DIAG: M25.512 (ICD-10-CM) - Left shoulder pain, unspecified chronicity   THERAPY DIAG:  Left shoulder pain, unspecified chronicity  Muscle weakness (generalized)  Stiffness of left shoulder, not elsewhere classified  Rationale for Evaluation and Treatment Rehabilitation  PERTINENT HISTORY: Unremarkable PMH   PRECAUTIONS: none  SUBJECTIVE:                                                                                                                                                                                      SUBJECTIVE STATEMENT:   Pain has calmed now. I have done a little bit of the exercises. No pain now, just a little tense.    PAIN:  Are you having pain: 4-5/10  Location: L anterior/superior shoulder  How would you describe your pain? stabbing Best in past week: 0/10 Worst in past week: 6/10 (7-8/10 usually eases within a few minutes, sometimes hours) Aggravating factors: lifting, reaching overhead, laundry, reaching into cabinet, sleeping, upper body dressing (AM more so than PM) Easing factors: heat, medication, ointment    OBJECTIVE: (objective measures completed at  initial evaluation unless otherwise dated)   DIAGNOSTIC FINDINGS:  MRI per [facility] 07/26/22 IMPRESSION: Small nondisplaced tear of the posteroinferior labrum from 7:00-8:00.   Focal irregularity of the anterior superior labrum at 1:00 with undercutting contrast from 12:00-1:00, favored to represent a small additional nondisplaced tear, versus normal variant.   No evidence of rotator cuff tear.   PATIENT SURVEYS:  QuickDASH: 47.7% 09/11/22:  15.9%  COGNITION: Overall cognitive status: Within functional limits for tasks assessed  SENSATION: Light touch intact BUE    POSTURE: Forward head, rounded shoulders, elevated UT   UPPER EXTREMITY ROM:   A/PROM Right eval Left eval L AROM 09/06/22 L AROM  09/25/22 L AROM 10/04/22  Shoulder flexion 168 142 s * 136 s 150 135  Shoulder abduction 166 136 s * 122 s 135 110  Shoulder internal rotation (functional) T10 T10 *     Shoulder external rotation (functional) T1 C7 *  T2 T2  Elbow flexion         Elbow extension         Wrist flexion         Wrist extension          (Blank rows = not tested) (Key: WFL = within functional limits not formally assessed, * = concordant pain, s = stiffness/stretching sensation, NT = not tested)  Comments:     UPPER EXTREMITY MMT:   MMT Right eval Left eval L 09/06/22  Shoulder flexion       Shoulder extension       Shoulder abduction       Shoulder extension       Shoulder internal rotation 5 3+ * 4 mild pain  Shoulder external rotation 5 3+ * 4 Mild pain   Elbow flexion 5 3+ *   Elbow extension 5 3+ *   Grip strength       (Blank rows = not tested)  (Key: WFL = within functional limits not formally assessed, * = concordant pain, s = stiffness/stretching sensation, NT = not tested)  Comments:    SHOULDER SPECIAL TESTS: Deferred given symptom irritability w/ ROM/MMT   PALPATION:  Concordant TTP L bicipital groove, deltoid, and infraspinatus/teres              [facility] Adult PT Treatment:                                                DATE: 10/04/22 Therapeutic Exercise: Shoulder flexion walk back 2 x 10 cues for comfortable ROM and pacing  RTB shoulder ext 3x8 cues for form and posture  Standing unilat  scaption 2# <90 deg 2 x 8  Seated tricep ext 8 x 1 Seated ER bilat red x 8 Seated bicep curl 2# 1 x 8 Supine chest press with dowel  x 8 Supine pullover with dowel x 8 S/L abduction 1 x 8 S/L ER AROM 2 x 8     TODAY'S TREATMENT:                                                                                              [facility] Adult PT Treatment:                                                DATE: 09/27/22 Therapeutic Exercise: Shoulder flexion walkback  2x10 cues for comfortable ROM and pacing RTB shoulder ext 3x8 cues for form and posture  Standing RTB tricep push down 2x8 LUE only cues for positioning and trunk angle Swiss ball rollout at table, standing, 2x10 cues for appropriate ROM  Sidelying ER AROM 2x8 L ER, second round w/ towel roll for positioning   [facility] Adult PT Treatment:                                                DATE: 09/25/22 Therapeutic Exercise: UBE Level 1 2 min each way  Plank at counter - attempted gentle weight shift, unable , pain Flexion walkback at counter x12  Hammer curl 3# x 8 -pain 4/10 Bicep curl 3# x 8  Red band horizontal abduction x 8- mini rom  S/L abduction x 8 S/L ER AROM x 8 Seated unilat  scaption 2# <90 deg x 8  Seated tricp ext red band x10 Modalities:  HMP x 10 min     [facility] Adult PT Treatment:                                                DATE: 09/20/22 Therapeutic Exercise: Counter planks 2x45sec  DB scaption unilat LUE 2# <90 deg 2x10 cues for form and pacing Seated bicep curl LUE 3# 2x8 cues for pacing and control Cable column tricep pull down LUE 3# 2x8 cues for neutral positioning and pacing  Cable column unilat mid row LUE 7# 2x10 cues for form  Flexion walkback at counter x12  HEP  review + education   PATIENT EDUCATION: Education details: Rationale for interventions, HEP review/update Person educated: Patient Education method: Consulting civil engineer, Demonstration, Corporate treasurer cues, Verbal cues, handout Education comprehension: verbalized understanding, returned demonstration, verbal cues required, tactile cues required, and needs further education     HOME EXERCISE PROGRAM: Access Code: [email or web address] URL: [email or web address] Date: 09/20/2022 Prepared by: Enis Slipper  Exercises - Standing 'L' Stretch at Counter  - 1 x daily - 7 x weekly - 2 sets - 6 reps - Supine Shoulder Horizontal Abduction with Resistance  - 1 x daily - 7 x weekly - 2 sets - 8 reps - Isometric Shoulder Abduction at Wall  - 1 x daily - 7 x weekly - 2 sets - 10 reps - Standing Shoulder Diagonal Horizontal Abduction 60/120 Degrees with Resistance  - 1 x daily - 7 x weekly - 2 sets - 8 reps   ASSESSMENT:   CLINICAL IMPRESSION: 10/04/2022 Pt arrives w/ 0/10 pain on NPS, reports decreased pain symptoms, however increased depression and insomnia. Given decreased symptom irritability/pain levels, progressed today's program to allow for improved functional tolerance. Primary report of muscular fatigue. No adverse events, pt endorses no overt change in pain on departure. Will need to re-eval next visit.   Eval - Patient is a pleasant 22 y.o. woman who was seen today for physical therapy evaluation and treatment for L shoulder pain since summer 2023, gradually worsening with exacerbation in Dec 2023. Pt states she has limitations in strength and mobility that are affecting her ability to perform ADLs and work activities, particular difficulty with reaching overhead and lifting heavy items. Symptoms are irritable on examination today but eases back to baseline  after a few minutes - pt does demo globally reduced UE MMT (elbow flex/ext and [facility] ER most provocative) and stiffness with GH AROM. Anticipate pt would  benefit from skilled PT to address these deficits in order to maximize functional independence/tolerance. No adverse events, pt denies any increase in pain on departure. Pt departs today's session in no acute distress, all voiced questions/concerns addressed appropriately from PT perspective.     OBJECTIVE IMPAIRMENTS: decreased activity tolerance, decreased endurance, decreased mobility, decreased ROM, decreased strength, hypomobility, impaired perceived functional ability, increased muscle spasms, impaired UE functional use, and pain.    ACTIVITY LIMITATIONS: carrying, lifting, sleeping, dressing, reach over head, and hygiene/grooming   PARTICIPATION LIMITATIONS: cleaning, laundry, and occupation   PERSONAL FACTORS: Time since onset of injury/illness/exacerbation are also affecting patient's functional outcome.    REHAB POTENTIAL: Good   CLINICAL DECISION MAKING: Evolving/moderate complexity   EVALUATION COMPLEXITY: Low     GOALS: Goals reviewed with patient? No   SHORT TERM GOALS: Target date: 09/08/2022 Pt will demonstrate appropriate understanding and performance of initially prescribed HEP in order to facilitate improved independence with management of symptoms.  Baseline: HEP provided on eval 09/06/22: Pt endorses good performance of HEP at home Goal status: MET   2. Pt will score less than or equal to 39% on Quick DASH in order to demonstrate improved perception of function due to symptoms.            Baseline: 47.7%  09/11/22: 19%            Goal status: MET   LONG TERM GOALS: Target date: 10/06/2022 Pt will score less than or equal 30% on Quick DASH in order to demonstrate improved perception of function due to symptoms. Baseline: 47.7% Goal status: INITIAL   2.  Pt will demonstrate at least 160 degrees of active L shoulder elevation in order to demonstrate improved tolerance to reaching overhead.  Baseline: see ROM chart above Goal status: INITIAL   3.  Pt will  demonstrate at least 4+/5 shoulder L ER/IR MMT for improved symmetry of UE strength and improved tolerance to functional movements.  Baseline: see MMT chart above Goal status: INITIAL   4. Pt will report/demonstrate ability to perform 30# floor<>waist lift with less than 2 point increase in pain on NPS in order to indicative improved tolerance/independence with work tasks.            Baseline: reports up to 8/10 pain with heavy tasks at work            Goal status: INITIAL    PLAN:   PT FREQUENCY: 2x/week   PT DURATION: 8 weeks   PLANNED INTERVENTIONS: Therapeutic exercises, Therapeutic activity, Neuromuscular re-education, Balance training, Gait training, Patient/Family education, Self Care, Joint mobilization, Joint manipulation, Dry Needling, Electrical stimulation, Spinal manipulation, Spinal mobilization, Cryotherapy, Moist heat, Taping, Manual therapy, and Re-evaluation   PLAN FOR NEXT SESSION:  gentle [place] mobility/stability, HEP review/update PRN    Hessie Diener, PTA 10/04/22 3:27 PM Phone: [phone] Fax: [phone]

## 2022-10-09 ENCOUNTER — Encounter: Payer: Self-pay | Admitting: Physical Therapy

## 2022-10-09 ENCOUNTER — Ambulatory Visit: Payer: Medicaid Other | Admitting: Physical Therapy

## 2022-10-09 DIAGNOSIS — M6281 Muscle weakness (generalized): Secondary | ICD-10-CM

## 2022-10-09 DIAGNOSIS — M25612 Stiffness of left shoulder, not elsewhere classified: Secondary | ICD-10-CM

## 2022-10-09 DIAGNOSIS — M25512 Pain in left shoulder: Secondary | ICD-10-CM

## 2022-10-09 NOTE — Therapy (Signed)
OUTPATIENT PHYSICAL THERAPY PROGRESS NOTE + RECERTIFICATION   Patient Name: Denise Hendrix MRN: [id number] DOB:14-Jul-2001, 22 y.o., female Today's Date: 10/09/2022  Progress Note Reporting Period 08/14/22 to 10/09/22  See note below for Objective Data and Assessment of Progress/Goals.      PCP: Milford Cage, PA   REFERRING PROVIDER: Donella Stade, PA-C  END OF SESSION:   PT End of Session - 10/09/22 1546     Visit Number 11    Number of Visits 17    Date for PT Re-Evaluation 12/04/22    Authorization Type [facility] [employer]    Authorization Time Period no auth first 12 visits    Authorization - Visit Number 11    Authorization - Number of Visits 12    PT Start Time [id number]    PT Stop Time 1629    PT Time Calculation (min) 43 min    Activity Tolerance Patient tolerated treatment well    Behavior During Therapy WFL for tasks assessed/performed               History reviewed. No pertinent past medical history. History reviewed. No pertinent surgical history. There are no problems to display for this patient.   REFERRING DIAG: M25.512 (ICD-10-CM) - Left shoulder pain, unspecified chronicity   THERAPY DIAG:  Left shoulder pain, unspecified chronicity  Muscle weakness (generalized)  Stiffness of left shoulder, not elsewhere classified  Rationale for Evaluation and Treatment Rehabilitation  PERTINENT HISTORY: Unremarkable PMH   PRECAUTIONS: none  SUBJECTIVE:                                                                                                                                                                                      SUBJECTIVE STATEMENT:   Pt states she is feeling much better since starting therapy. "I can move again", also improving tolerance to work tasks. Still has most difficulty with lifting and overhead endurance. No longer having pain with most daily activities, more so work tasks     PAIN:  Are you having pain: 2/10   Location: L anterior/superior shoulder  How would you describe your pain? "Like a grip" Best in past week: 0/10 Worst in past week: 6/10 ( on eval 7-8/10 usually eases within a few minutes, sometimes hours) Aggravating factors: lifting, reaching overhead, laundry, reaching into cabinet, sleeping Easing factors: heat, medication, ointment    OBJECTIVE: (objective measures completed at initial evaluation unless otherwise dated)   DIAGNOSTIC FINDINGS:  MRI per [facility] 07/26/22 IMPRESSION: Small nondisplaced tear of the posteroinferior labrum from 7:00-8:00.   Focal irregularity of the anterior superior labrum at 1:00 with undercutting contrast from 12:00-1:00, favored to  represent a small additional nondisplaced tear, versus normal variant.   No evidence of rotator cuff tear.   PATIENT SURVEYS:  QuickDASH: 47.7% 09/11/22:  15.9%  10/09/22: 11.4 %  COGNITION: Overall cognitive status: Within functional limits for tasks assessed                                  SENSATION: Light touch intact BUE    POSTURE: Forward head, rounded shoulders, elevated UT   UPPER EXTREMITY ROM:   A/PROM Right eval Left eval L AROM 09/06/22 L AROM  09/25/22 L AROM 10/04/22 L AROM 10/09/22  Shoulder flexion 168 142 s * 136 s 150 135 145 deg p!  Shoulder abduction 166 136 s * 122 s 135 110 136 deg p!  Shoulder internal rotation (functional) T10 T10 *    T5 painless  Shoulder external rotation (functional) T1 C7 *  T2 T2 T3 painless  Elbow flexion          Elbow extension          Wrist flexion          Wrist extension           (Blank rows = not tested) (Key: WFL = within functional limits not formally assessed, * = concordant pain, s = stiffness/stretching sensation, NT = not tested)  Comments:     UPPER EXTREMITY MMT:   MMT Right eval Left eval L 09/06/22 R/L 10/09/22  Shoulder flexion        Shoulder extension        Shoulder abduction        Shoulder extension        Shoulder internal  rotation 5 3+ * 4 mild pain 5/4+  Shoulder external rotation 5 3+ * 4 Mild pain  5/4+ p!  Elbow flexion 5 3+ *  5/4+ p!  Elbow extension 5 3+ *  5/4+ p!  Grip strength        (Blank rows = not tested)  (Key: WFL = within functional limits not formally assessed, * = concordant pain, s = stiffness/stretching sensation, NT = not tested)  Comments:    SHOULDER SPECIAL TESTS: Deferred given symptom irritability w/ ROM/MMT   PALPATION:  Concordant TTP L bicipital groove, deltoid, and infraspinatus/teres  FUNCTIONAL TESTING: 10/09/22: 5# KB pickup 5 reps, painless 10# KB pickup 5 reps, mild pulling no pain 15# KB pickup 5 reps, increased fatigue               TODAY'S TREATMENT:                                                                                               [facility] Adult PT Treatment:                                                DATE: 10/09/22 Therapeutic Exercise: Bicep curl 3# 2x5 cues for  pacing  B ER + scapular retraction 2x10 cues for form and posture  Shoulder flexion walkback at counter x12 cues for appropriate ROM and form  Therapeutic Activity: 5# pickup x5 floor<>waist 10# pickup x5 floor<>waist 15# pickup x5 floor<>waist MSK assessment + education Quick DASH + education    [facility] Adult PT Treatment:                                                DATE: 10/04/22 Therapeutic Exercise: Shoulder flexion walk back 2 x 10 cues for comfortable ROM and pacing  RTB shoulder ext 3x8 cues for form and posture  Standing unilat  scaption 2# <90 deg 2 x 8  Seated tricep ext 8 x 1 Seated ER bilat red x 8 Seated bicep curl 2# 1 x 8 Supine chest press with dowel  x 8 Supine pullover with dowel x 8 S/L abduction 1 x 8 S/L ER AROM 2 x 8   [facility] Adult PT Treatment:                                                DATE: 09/27/22 Therapeutic Exercise: Shoulder flexion walkback 2x10 cues for comfortable ROM and pacing RTB shoulder ext 3x8 cues for form and posture  Standing RTB  tricep push down 2x8 LUE only cues for positioning and trunk angle Swiss ball rollout at table, standing, 2x10 cues for appropriate ROM  Sidelying ER AROM 2x8 L ER, second round w/ towel roll for positioning    PATIENT EDUCATION: Education details: Rationale for interventions, HEP review Person educated: Patient Education method: Explanation, Demonstration, Tactile cues, Verbal cues, handout Education comprehension: verbalized understanding, returned demonstration, verbal cues required, tactile cues required, and needs further education     HOME EXERCISE PROGRAM: Access Code: [email or web address] URL: [email or web address] Date: 09/20/2022 Prepared by: Enis Slipper  Exercises - Standing 'L' Stretch at Counter  - 1 x daily - 7 x weekly - 2 sets - 6 reps - Supine Shoulder Horizontal Abduction with Resistance  - 1 x daily - 7 x weekly - 2 sets - 8 reps - Isometric Shoulder Abduction at Wall  - 1 x daily - 7 x weekly - 2 sets - 10 reps - Standing Shoulder Diagonal Horizontal Abduction 60/120 Degrees with Resistance  - 1 x daily - 7 x weekly - 2 sets - 8 reps   ASSESSMENT:   CLINICAL IMPRESSION: 10/09/2022 Pt arrives w/ 2/10 pain at present, notes overall she has had significant improvements since starting PT, primary persisting limitations are with heavy lifting and overhead endurance. Goals addressed as below, continues to demo improved mobility and strength although some deficits remain, particularly with mobility. Recommend continuing along updated POC as outlined below as pt continues to have difficulty with work activities and more prolonged overhead activity. Tolerates today's session well overall with progressed for increased volume with biceps/RC work, departs with 3/10 pain, no adverse events. Pt departs today's session in no acute distress, all voiced questions/concerns addressed appropriately from PT perspective.     Eval - Patient is a pleasant 22 y.o. woman who was seen today  for physical therapy evaluation and treatment for L shoulder pain since summer 2023, gradually worsening with exacerbation in Dec  2023. Pt states she has limitations in strength and mobility that are affecting her ability to perform ADLs and work activities, particular difficulty with reaching overhead and lifting heavy items. Symptoms are irritable on examination today but eases back to baseline after a few minutes - pt does demo globally reduced UE MMT (elbow flex/ext and [facility] ER most provocative) and stiffness with GH AROM. Anticipate pt would benefit from skilled PT to address these deficits in order to maximize functional independence/tolerance. No adverse events, pt denies any increase in pain on departure. Pt departs today's session in no acute distress, all voiced questions/concerns addressed appropriately from PT perspective.     OBJECTIVE IMPAIRMENTS: decreased activity tolerance, decreased endurance, decreased mobility, decreased ROM, decreased strength, hypomobility, impaired perceived functional ability, increased muscle spasms, impaired UE functional use, and pain.    ACTIVITY LIMITATIONS: carrying, lifting, sleeping, dressing, reach over head, and hygiene/grooming   PARTICIPATION LIMITATIONS: cleaning, laundry, and occupation   PERSONAL FACTORS: Time since onset of injury/illness/exacerbation are also affecting patient's functional outcome.    REHAB POTENTIAL: Good   CLINICAL DECISION MAKING: Evolving/moderate complexity   EVALUATION COMPLEXITY: Low     GOALS: Goals reviewed with patient? No   SHORT TERM GOALS: Target date: 09/08/2022 Pt will demonstrate appropriate understanding and performance of initially prescribed HEP in order to facilitate improved independence with management of symptoms.  Baseline: HEP provided on eval 09/06/22: Pt endorses good performance of HEP at home Goal status: MET   2. Pt will score less than or equal to 39% on Quick DASH in order to demonstrate  improved perception of function due to symptoms.            Baseline: 47.7%  09/11/22: 19%            Goal status: MET   LONG TERM GOALS: Target date: 11/06/2022 (updated on 10/09/22)   Pt will score less than or equal 30% on Quick DASH in order to demonstrate improved perception of function due to symptoms. Baseline: 47.7% 10/09/22: 11.4% Goal status: MET    2.  Pt will demonstrate at least 160 degrees of active L shoulder elevation in order to demonstrate improved tolerance to reaching overhead.  Baseline: see ROM chart above 10/09/22: see ROM chart above Goal status: ONGOING   3.  Pt will demonstrate at least 4+/5 shoulder L ER/IR MMT for improved symmetry of UE strength and improved tolerance to functional movements.  Baseline: see MMT chart above 10/09/22: see MMT chart above Goal status: PROGRESSING   4. Pt will report/demonstrate ability to perform 30# floor<>waist lift with less than 2 point increase in pain on NPS in order to indicative improved tolerance/independence with work tasks.            Baseline: reports up to 8/10 pain with heavy tasks at work  10/09/22: 15# x5 pickup with increased strain            Goal status: PROGRESSING    PLAN (updated 10/09/22):   PT FREQUENCY: 2x/week   PT DURATION: 4 weeks   PLANNED INTERVENTIONS: Therapeutic exercises, Therapeutic activity, Neuromuscular re-education, Balance training, Gait training, Patient/Family education, Self Care, Joint mobilization, Joint manipulation, Dry Needling, Electrical stimulation, Spinal manipulation, Spinal mobilization, Cryotherapy, Moist heat, Taping, Manual therapy, and Re-evaluation   PLAN FOR NEXT SESSION:  continue [place] mobility/stability, HEP review/update PRN    Leeroy Cha PT, DPT 10/09/2022 5:47 PM

## 2022-10-23 ENCOUNTER — Ambulatory Visit: Payer: Medicaid Other | Attending: Surgical | Admitting: Physical Therapy

## 2022-10-23 ENCOUNTER — Encounter: Payer: Self-pay | Admitting: Physical Therapy

## 2022-10-23 DIAGNOSIS — M6281 Muscle weakness (generalized): Secondary | ICD-10-CM | POA: Insufficient documentation

## 2022-10-23 DIAGNOSIS — M25512 Pain in left shoulder: Secondary | ICD-10-CM | POA: Diagnosis not present

## 2022-10-23 DIAGNOSIS — M25612 Stiffness of left shoulder, not elsewhere classified: Secondary | ICD-10-CM | POA: Insufficient documentation

## 2022-10-23 NOTE — Therapy (Signed)
OUTPATIENT PHYSICAL THERAPY TREATMENT NOTE   Patient Name: Denise Hendrix MRN: [id number] DOB:Sep 20, 2000, 22 y.o., female Today's Date: 10/23/2022      PCP: Paschal Dopp, PA   REFERRING PROVIDER: Julieanne Cotton, PA-C  END OF SESSION:   PT End of Session - 10/23/22 1458     Visit Number 12    Number of Visits 17    Date for PT Re-Evaluation 12/04/22    Authorization Type [facility] [employer]    Authorization Time Period no auth first 27 visits    PT Start Time 1500    PT Stop Time 1540    PT Time Calculation (min) 40 min    Activity Tolerance Patient tolerated treatment well    Behavior During Therapy [facility] for tasks assessed/performed                History reviewed. No pertinent past medical history. History reviewed. No pertinent surgical history. There are no problems to display for this patient.   REFERRING DIAG: M25.512 (ICD-10-CM) - Left shoulder pain, unspecified chronicity   THERAPY DIAG:  Left shoulder pain, unspecified chronicity  Muscle weakness (generalized)  Stiffness of left shoulder, not elsewhere classified  Rationale for Evaluation and Treatment Rehabilitation  PERTINENT HISTORY: Unremarkable PMH   PRECAUTIONS: none  SUBJECTIVE:                                                                                                                                                                                      SUBJECTIVE STATEMENT:   Pt states she was doing well although Friday she was lifting tea urns at work, felt a pinch and increased symptoms for 2-3 days. Now back to baseline  PAIN:  Are you having pain: 0/10  Location: L anterior/superior shoulder  How would you describe your pain? "Like a grip" Best in past week: 0/10 Worst in past week: 6/10 ( on eval 7-8/10 usually eases within a few minutes, sometimes hours) Aggravating factors: lifting, reaching overhead, laundry, reaching into cabinet, sleeping Easing factors: heat,  medication, ointment    OBJECTIVE: (objective measures completed at initial evaluation unless otherwise dated)   DIAGNOSTIC FINDINGS:  MRI per [facility] 07/26/22 IMPRESSION: Small nondisplaced tear of the posteroinferior labrum from 7:00-8:00.   Focal irregularity of the anterior superior labrum at 1:00 with undercutting contrast from 12:00-1:00, favored to represent a small additional nondisplaced tear, versus normal variant.   No evidence of rotator cuff tear.   PATIENT SURVEYS:  QuickDASH: 47.7% 09/11/22:  15.9%  10/09/22: 11.4 %  COGNITION: Overall cognitive status: Within functional limits for tasks assessed  SENSATION: Light touch intact BUE    POSTURE: Forward head, rounded shoulders, elevated UT   UPPER EXTREMITY ROM:   A/PROM Right eval Left eval L AROM 09/06/22 L AROM  09/25/22 L AROM 10/04/22 L AROM 10/09/22  Shoulder flexion 168 142 s * 136 s 150 135 145 deg p!  Shoulder abduction 166 136 s * 122 s 135 110 136 deg p!  Shoulder internal rotation (functional) T10 T10 *    T5 painless  Shoulder external rotation (functional) T1 C7 *  T2 T2 T3 painless  Elbow flexion          Elbow extension          Wrist flexion          Wrist extension           (Blank rows = not tested) (Key: WFL = within functional limits not formally assessed, * = concordant pain, s = stiffness/stretching sensation, NT = not tested)  Comments:     UPPER EXTREMITY MMT:   MMT Right eval Left eval L 09/06/22 R/L 10/09/22  Shoulder flexion        Shoulder extension        Shoulder abduction        Shoulder extension        Shoulder internal rotation 5 3+ * 4 mild pain 5/4+  Shoulder external rotation 5 3+ * 4 Mild pain  5/4+ p!  Elbow flexion 5 3+ *  5/4+ p!  Elbow extension 5 3+ *  5/4+ p!  Grip strength        (Blank rows = not tested)  (Key: WFL = within functional limits not formally assessed, * = concordant pain, s = stiffness/stretching sensation, NT  = not tested)  Comments:    SHOULDER SPECIAL TESTS: Deferred given symptom irritability w/ ROM/MMT   PALPATION:  Concordant TTP L bicipital groove, deltoid, and infraspinatus/teres  FUNCTIONAL TESTING: 10/09/22: 5# KB pickup 5 reps, painless 10# KB pickup 5 reps, mild pulling no pain 15# KB pickup 5 reps, increased fatigue               TODAY'S TREATMENT:                                                                                               [facility] Adult PT Treatment:                                                DATE: 10/23/22 Therapeutic Exercise: Swiss ball flexion up wall 2x10 cues for form and appropriate ROM Swiss ball RC perturbation 3x30sec at ~100deg flexion cues for setup BIL scaption w/ RTB 2x8 cues for form and comfortable ROM  Bicep curl 4# 2x6 cues for form and pacing  15# KB front carry 3x32ft cues for form and posture  Shoulder flexion walkback x12    [facility] Adult PT Treatment:  DATE: 10/09/22 Therapeutic Exercise: Bicep curl 3# 2x5 cues for pacing  B ER + scapular retraction 2x10 cues for form and posture  Shoulder flexion walkback at counter x12 cues for appropriate ROM and form  Therapeutic Activity: 5# pickup x5 floor<>waist 10# pickup x5 floor<>waist 15# pickup x5 floor<>waist MSK assessment + education Quick DASH + education    [facility] Adult PT Treatment:                                                DATE: 10/04/22 Therapeutic Exercise: Shoulder flexion walk back 2 x 10 cues for comfortable ROM and pacing  RTB shoulder ext 3x8 cues for form and posture  Standing unilat  scaption 2# <90 deg 2 x 8  Seated tricep ext 8 x 1 Seated ER bilat red x 8 Seated bicep curl 2# 1 x 8 Supine chest press with dowel  x 8 Supine pullover with dowel x 8 S/L abduction 1 x 8 S/L ER AROM 2 x 8   [facility] Adult PT Treatment:                                                DATE: 09/27/22 Therapeutic Exercise: Shoulder flexion  walkback 2x10 cues for comfortable ROM and pacing RTB shoulder ext 3x8 cues for form and posture  Standing RTB tricep push down 2x8 LUE only cues for positioning and trunk angle Swiss ball rollout at table, standing, 2x10 cues for appropriate ROM  Sidelying ER AROM 2x8 L ER, second round w/ towel roll for positioning    PATIENT EDUCATION: Education details: Rationale for interventions, HEP update Person educated: Patient Education method: Explanation, Demonstration, Tactile cues, Verbal cues, handout Education comprehension: verbalized understanding, returned demonstration, verbal cues required, tactile cues required, and needs further education     HOME EXERCISE PROGRAM: Access Code: [email or web address] URL: [email or web address] Date: 10/23/2022 Prepared by: Fransisco Hertz  Exercises - Standing 'L' Stretch at Counter  - 1 x daily - 7 x weekly - 2 sets - 6 reps - Supine Shoulder Horizontal Abduction with Resistance  - 1 x daily - 7 x weekly - 2 sets - 8 reps - Isometric Shoulder Abduction at Wall  - 1 x daily - 7 x weekly - 2 sets - 10 reps - Standing Shoulder Diagonal Horizontal Abduction 60/120 Degrees with Resistance  - 1 x daily - 7 x weekly - 2 sets - 8 reps - Seated Elbow Flexion with Self-Anchored Resistance  - 1 x daily - 7 x weekly - 2 sets - 10 reps   ASSESSMENT:   CLINICAL IMPRESSION: 10/23/2022 Pt arrives w/o pain at present although she does note an exacerbation over weekend after lifting tea urns. Today focusing on increasing demand on GH musculature in elevated positions and continued progression of biceps strengthening. Tolerates well with muscular fatigue although she does endorse 2/10 pain at end of session. No adverse events, HEP updated to include gentle biceps strengthening with education on monitoring tolerance as biceps work remains most provocative movement. Pt departs today's session in no acute distress, all voiced questions/concerns addressed appropriately  from PT perspective.    Eval - Patient is a pleasant 22 y.o. woman who was seen today for physical therapy  evaluation and treatment for L shoulder pain since summer 2023, gradually worsening with exacerbation in Dec 2023. Pt states she has limitations in strength and mobility that are affecting her ability to perform ADLs and work activities, particular difficulty with reaching overhead and lifting heavy items. Symptoms are irritable on examination today but eases back to baseline after a few minutes - pt does demo globally reduced UE MMT (elbow flex/ext and [facility] ER most provocative) and stiffness with GH AROM. Anticipate pt would benefit from skilled PT to address these deficits in order to maximize functional independence/tolerance. No adverse events, pt denies any increase in pain on departure. Pt departs today's session in no acute distress, all voiced questions/concerns addressed appropriately from PT perspective.     OBJECTIVE IMPAIRMENTS: decreased activity tolerance, decreased endurance, decreased mobility, decreased ROM, decreased strength, hypomobility, impaired perceived functional ability, increased muscle spasms, impaired UE functional use, and pain.    ACTIVITY LIMITATIONS: carrying, lifting, sleeping, dressing, reach over head, and hygiene/grooming   PARTICIPATION LIMITATIONS: cleaning, laundry, and occupation   PERSONAL FACTORS: Time since onset of injury/illness/exacerbation are also affecting patient's functional outcome.    REHAB POTENTIAL: Good   CLINICAL DECISION MAKING: Evolving/moderate complexity   EVALUATION COMPLEXITY: Low     GOALS: Goals reviewed with patient? No   SHORT TERM GOALS: Target date: 09/08/2022 Pt will demonstrate appropriate understanding and performance of initially prescribed HEP in order to facilitate improved independence with management of symptoms.  Baseline: HEP provided on eval 09/06/22: Pt endorses good performance of HEP at home Goal status:  MET   2. Pt will score less than or equal to 39% on Quick DASH in order to demonstrate improved perception of function due to symptoms.            Baseline: 47.7%  09/11/22: 19%            Goal status: MET   LONG TERM GOALS: Target date: 11/06/2022 (updated on 10/09/22)   Pt will score less than or equal 30% on Quick DASH in order to demonstrate improved perception of function due to symptoms. Baseline: 47.7% 10/09/22: 11.4% Goal status: MET    2.  Pt will demonstrate at least 160 degrees of active L shoulder elevation in order to demonstrate improved tolerance to reaching overhead.  Baseline: see ROM chart above 10/09/22: see ROM chart above Goal status: ONGOING   3.  Pt will demonstrate at least 4+/5 shoulder L ER/IR MMT for improved symmetry of UE strength and improved tolerance to functional movements.  Baseline: see MMT chart above 10/09/22: see MMT chart above Goal status: PROGRESSING   4. Pt will report/demonstrate ability to perform 30# floor<>waist lift with less than 2 point increase in pain on NPS in order to indicative improved tolerance/independence with work tasks.            Baseline: reports up to 8/10 pain with heavy tasks at work  10/09/22: 15# x5 pickup with increased strain            Goal status: PROGRESSING    PLAN (updated 10/09/22):   PT FREQUENCY: 2x/week   PT DURATION: 4 weeks   PLANNED INTERVENTIONS: Therapeutic exercises, Therapeutic activity, Neuromuscular re-education, Balance training, Gait training, Patient/Family education, Self Care, Joint mobilization, Joint manipulation, Dry Needling, Electrical stimulation, Spinal manipulation, Spinal mobilization, Cryotherapy, Moist heat, Taping, Manual therapy, and Re-evaluation   PLAN FOR NEXT SESSION:  continue GH mobility/stability, HEP review/update PRN     Ashley Murrain PT, DPT 10/23/2022  3:57 PM

## 2022-10-25 NOTE — Therapy (Signed)
OUTPATIENT PHYSICAL THERAPY TREATMENT NOTE   Patient Name: Denise Hendrix MRN: [id number] DOB:2000/09/28, 22 y.o., female Today's Date: 10/26/2022      PCP: Paschal Dopp, PA   REFERRING PROVIDER: Julieanne Cotton, PA-C  END OF SESSION:   PT End of Session - 10/26/22 1503     Visit Number 13    Number of Visits 17    Date for PT Re-Evaluation 12/04/22    Authorization Type [facility] [employer]    Authorization Time Period no auth first 27 visits    PT Start Time 1504   late check in   PT Stop Time 1543    PT Time Calculation (min) 39 min    Activity Tolerance Patient tolerated treatment well    Behavior During Therapy [facility] for tasks assessed/performed              History reviewed. No pertinent past medical history. History reviewed. No pertinent surgical history. There are no problems to display for this patient.   REFERRING DIAG: M25.512 (ICD-10-CM) - Left shoulder pain, unspecified chronicity   THERAPY DIAG:  Left shoulder pain, unspecified chronicity  Muscle weakness (generalized)  Stiffness of left shoulder, not elsewhere classified  Rationale for Evaluation and Treatment Rehabilitation  PERTINENT HISTORY: Unremarkable PMH   PRECAUTIONS: none  SUBJECTIVE:                                                                                                                                                                                      SUBJECTIVE STATEMENT:   Pt states she had to do a lot of heavy lifting at work today and is having increased back and shoulder pain.    PAIN:  Are you having pain: 5/10  Location: L anterior/superior shoulder  How would you describe your pain? "Like a grip" Best in past week: 0/10 Worst in past week: 6/10 ( on eval 7-8/10 usually eases within a few minutes, sometimes hours) Aggravating factors: lifting, reaching overhead, laundry, reaching into cabinet, sleeping Easing factors: heat, medication, ointment    Next MD visit: Beginning of May   OBJECTIVE: (objective measures completed at initial evaluation unless otherwise dated)   DIAGNOSTIC FINDINGS:  MRI per [facility] 07/26/22 IMPRESSION: Small nondisplaced tear of the posteroinferior labrum from 7:00-8:00.   Focal irregularity of the anterior superior labrum at 1:00 with undercutting contrast from 12:00-1:00, favored to represent a small additional nondisplaced tear, versus normal variant.   No evidence of rotator cuff tear.   PATIENT SURVEYS:  QuickDASH: 47.7% 09/11/22:  15.9%  10/09/22: 11.4 %  COGNITION: Overall cognitive status: Within functional limits for tasks assessed  SENSATION: Light touch intact BUE    POSTURE: Forward head, rounded shoulders, elevated UT   UPPER EXTREMITY ROM:   A/PROM Right eval Left eval L AROM 09/06/22 L AROM  09/25/22 L AROM 10/04/22 L AROM 10/09/22  Shoulder flexion 168 142 s * 136 s 150 135 145 deg p!  Shoulder abduction 166 136 s * 122 s 135 110 136 deg p!  Shoulder internal rotation (functional) T10 T10 *    T5 painless  Shoulder external rotation (functional) T1 C7 *  T2 T2 T3 painless  Elbow flexion          Elbow extension          Wrist flexion          Wrist extension           (Blank rows = not tested) (Key: WFL = within functional limits not formally assessed, * = concordant pain, s = stiffness/stretching sensation, NT = not tested)  Comments:     UPPER EXTREMITY MMT:   MMT Right eval Left eval L 09/06/22 R/L 10/09/22  Shoulder flexion        Shoulder extension        Shoulder abduction        Shoulder extension        Shoulder internal rotation 5 3+ * 4 mild pain 5/4+  Shoulder external rotation 5 3+ * 4 Mild pain  5/4+ p!  Elbow flexion 5 3+ *  5/4+ p!  Elbow extension 5 3+ *  5/4+ p!  Grip strength        (Blank rows = not tested)  (Key: WFL = within functional limits not formally assessed, * = concordant pain, s = stiffness/stretching  sensation, NT = not tested)  Comments:    SHOULDER SPECIAL TESTS: Deferred given symptom irritability w/ ROM/MMT   PALPATION:  Concordant TTP L bicipital groove, deltoid, and infraspinatus/teres  FUNCTIONAL TESTING: 10/09/22: 5# KB pickup 5 reps, painless 10# KB pickup 5 reps, mild pulling no pain 15# KB pickup 5 reps, increased fatigue               TODAY'S TREATMENT:                                                                                               [facility] Adult PT Treatment:                                                DATE: 10/26/22 Therapeutic Exercise: Swiss ball flexion rollout, standing at table, x10 cues for form and appropriate ROM  Swiss ball scaption rollout, standing at table, x10 cues for comfortable ROM  Doorway rhomboid stretch, LUE only 2x6 with 5 sec hold cues for comfortable ROM  LS stretch 2x30sec L only cues for form and comfortable ROM  UT stretch 2x30sec L only cues for comfortable ROM  Supine shoulder flexion AAROM w/ dowel 2x8 cues for setup and appropriate ROM  [facility] Adult PT Treatment:                                                DATE: 10/23/22 Therapeutic Exercise: Swiss ball flexion up wall 2x10 cues for form and appropriate ROM Swiss ball RC perturbation 3x30sec at ~100deg flexion cues for setup BIL scaption w/ RTB 2x8 cues for form and comfortable ROM  Bicep curl 4# 2x6 cues for form and pacing  15# KB front carry 3x67ft cues for form and posture  Shoulder flexion walkback x12    [facility] Adult PT Treatment:                                                DATE: 10/09/22 Therapeutic Exercise: Bicep curl 3# 2x5 cues for pacing  B ER + scapular retraction 2x10 cues for form and posture  Shoulder flexion walkback at counter x12 cues for appropriate ROM and form  Therapeutic Activity: 5# pickup x5 floor<>waist 10# pickup x5 floor<>waist 15# pickup x5 floor<>waist MSK assessment + education Quick DASH + education    PATIENT  EDUCATION: Education details: Rationale for interventions, HEP update Person educated: Patient Education method: Explanation, Demonstration, Tactile cues, Verbal cues, handout Education comprehension: verbalized understanding, returned demonstration, verbal cues required, tactile cues required, and needs further education     HOME EXERCISE PROGRAM: Access Code: [email or web address] URL: [email or web address] Date: 10/23/2022 Prepared by: Fransisco Hertz  Exercises - Standing 'L' Stretch at Counter  - 1 x daily - 7 x weekly - 2 sets - 6 reps - Supine Shoulder Horizontal Abduction with Resistance  - 1 x daily - 7 x weekly - 2 sets - 8 reps - Isometric Shoulder Abduction at Wall  - 1 x daily - 7 x weekly - 2 sets - 10 reps - Standing Shoulder Diagonal Horizontal Abduction 60/120 Degrees with Resistance  - 1 x daily - 7 x weekly - 2 sets - 8 reps - Seated Elbow Flexion with Self-Anchored Resistance  - 1 x daily - 7 x weekly - 2 sets - 10 reps   ASSESSMENT:   CLINICAL IMPRESSION: 10/26/2022 Pt arrives w/ 5/10 pain and fatigue she attributes to increased lifting at work today, subsequently modified program to focus on symptom modification and tissue extensibility. Most relief from rhomboid stretch in doorway. Gradual improvement in pain as session goes on. Departs with 3/10 pain and improved stiffness. No adverse events. Pt departs today's session in no acute distress, all voiced questions/concerns addressed appropriately from PT perspective.    Eval - Patient is a pleasant 22 y.o. woman who was seen today for physical therapy evaluation and treatment for L shoulder pain since summer 2023, gradually worsening with exacerbation in Dec 2023. Pt states she has limitations in strength and mobility that are affecting her ability to perform ADLs and work activities, particular difficulty with reaching overhead and lifting heavy items. Symptoms are irritable on examination today but eases back to baseline  after a few minutes - pt does demo globally reduced UE MMT (elbow flex/ext and [facility] ER most provocative) and stiffness with GH AROM. Anticipate pt would benefit from skilled PT to address these deficits in order to maximize functional independence/tolerance. No adverse events, pt denies any increase  in pain on departure. Pt departs today's session in no acute distress, all voiced questions/concerns addressed appropriately from PT perspective.     OBJECTIVE IMPAIRMENTS: decreased activity tolerance, decreased endurance, decreased mobility, decreased ROM, decreased strength, hypomobility, impaired perceived functional ability, increased muscle spasms, impaired UE functional use, and pain.    ACTIVITY LIMITATIONS: carrying, lifting, sleeping, dressing, reach over head, and hygiene/grooming   PARTICIPATION LIMITATIONS: cleaning, laundry, and occupation   PERSONAL FACTORS: Time since onset of injury/illness/exacerbation are also affecting patient's functional outcome.    REHAB POTENTIAL: Good   CLINICAL DECISION MAKING: Evolving/moderate complexity   EVALUATION COMPLEXITY: Low     GOALS: Goals reviewed with patient? No   SHORT TERM GOALS: Target date: 09/08/2022 Pt will demonstrate appropriate understanding and performance of initially prescribed HEP in order to facilitate improved independence with management of symptoms.  Baseline: HEP provided on eval 09/06/22: Pt endorses good performance of HEP at home Goal status: MET   2. Pt will score less than or equal to 39% on Quick DASH in order to demonstrate improved perception of function due to symptoms.            Baseline: 47.7%  09/11/22: 19%            Goal status: MET   LONG TERM GOALS: Target date: 11/06/2022 (updated on 10/09/22)   Pt will score less than or equal 30% on Quick DASH in order to demonstrate improved perception of function due to symptoms. Baseline: 47.7% 10/09/22: 11.4% Goal status: MET    2.  Pt will demonstrate at  least 160 degrees of active L shoulder elevation in order to demonstrate improved tolerance to reaching overhead.  Baseline: see ROM chart above 10/09/22: see ROM chart above Goal status: ONGOING   3.  Pt will demonstrate at least 4+/5 shoulder L ER/IR MMT for improved symmetry of UE strength and improved tolerance to functional movements.  Baseline: see MMT chart above 10/09/22: see MMT chart above Goal status: PROGRESSING   4. Pt will report/demonstrate ability to perform 30# floor<>waist lift with less than 2 point increase in pain on NPS in order to indicative improved tolerance/independence with work tasks.            Baseline: reports up to 8/10 pain with heavy tasks at work  10/09/22: 15# x5 pickup with increased strain            Goal status: PROGRESSING    PLAN (updated 10/09/22):   PT FREQUENCY: 2x/week   PT DURATION: 4 weeks   PLANNED INTERVENTIONS: Therapeutic exercises, Therapeutic activity, Neuromuscular re-education, Balance training, Gait training, Patient/Family education, Self Care, Joint mobilization, Joint manipulation, Dry Needling, Electrical stimulation, Spinal manipulation, Spinal mobilization, Cryotherapy, Moist heat, Taping, Manual therapy, and Re-evaluation   PLAN FOR NEXT SESSION:  continue GH mobility/stability, HEP review/update PRN    Ashley Murrainavid A Mattelyn Imhoff PT, DPT 10/26/2022 5:26 PM

## 2022-10-26 ENCOUNTER — Encounter: Payer: Self-pay | Admitting: Physical Therapy

## 2022-10-26 ENCOUNTER — Ambulatory Visit: Payer: Medicaid Other | Admitting: Physical Therapy

## 2022-10-26 DIAGNOSIS — M6281 Muscle weakness (generalized): Secondary | ICD-10-CM

## 2022-10-26 DIAGNOSIS — M25512 Pain in left shoulder: Secondary | ICD-10-CM

## 2022-10-26 DIAGNOSIS — M25612 Stiffness of left shoulder, not elsewhere classified: Secondary | ICD-10-CM

## 2022-10-30 ENCOUNTER — Ambulatory Visit: Payer: Medicaid Other | Admitting: Physical Therapy

## 2022-10-30 ENCOUNTER — Encounter: Payer: Self-pay | Admitting: Physical Therapy

## 2022-10-30 DIAGNOSIS — M25512 Pain in left shoulder: Secondary | ICD-10-CM | POA: Diagnosis not present

## 2022-10-30 DIAGNOSIS — M6281 Muscle weakness (generalized): Secondary | ICD-10-CM

## 2022-10-30 DIAGNOSIS — M25612 Stiffness of left shoulder, not elsewhere classified: Secondary | ICD-10-CM

## 2022-10-30 NOTE — Therapy (Signed)
OUTPATIENT PHYSICAL THERAPY TREATMENT NOTE   Patient Name: Jameka Ivie MRN: [id number] DOB:2000/11/03, 22 y.o., female Today's Date: 10/30/2022      PCP: Paschal Dopp, PA   REFERRING PROVIDER: Julieanne Cotton, PA-C  END OF SESSION:   PT End of Session - 10/30/22 1631     Visit Number 14    Number of Visits 17    Date for PT Re-Evaluation 12/04/22    Authorization Type [facility] [employer]    Authorization Time Period no auth first 27 visits    PT Start Time 1634    PT Stop Time 1716    PT Time Calculation (min) 42 min    Activity Tolerance Patient tolerated treatment well;No increased pain    Behavior During Therapy [facility] for tasks assessed/performed               History reviewed. No pertinent past medical history. History reviewed. No pertinent surgical history. There are no problems to display for this patient.   REFERRING DIAG: M25.512 (ICD-10-CM) - Left shoulder pain, unspecified chronicity   THERAPY DIAG:  Left shoulder pain, unspecified chronicity  Muscle weakness (generalized)  Stiffness of left shoulder, not elsewhere classified  Rationale for Evaluation and Treatment Rehabilitation  PERTINENT HISTORY: Unremarkable PMH   PRECAUTIONS: none  SUBJECTIVE:                                                                                                                                                                                      SUBJECTIVE STATEMENT:   Pt states that she is feeling better today, continues to have difficulty with repetitive lifting (~20#)   PAIN:  Are you having pain: 0/10 at rest  Location: L anterior/superior shoulder  How would you describe your pain? "Like a grip" Best in past week: 0/10 Worst in past week: 6/10 ( on eval 7-8/10 usually eases within a few minutes, sometimes hours) Aggravating factors: lifting, reaching overhead, laundry, reaching into cabinet, sleeping Easing factors: heat, medication,  ointment   Next MD visit: Beginning of May   OBJECTIVE: (objective measures completed at initial evaluation unless otherwise dated)   DIAGNOSTIC FINDINGS:  MRI per [facility] 07/26/22 IMPRESSION: Small nondisplaced tear of the posteroinferior labrum from 7:00-8:00.   Focal irregularity of the anterior superior labrum at 1:00 with undercutting contrast from 12:00-1:00, favored to represent a small additional nondisplaced tear, versus normal variant.   No evidence of rotator cuff tear.   PATIENT SURVEYS:  QuickDASH: 47.7% 09/11/22:  15.9%  10/09/22: 11.4 %  COGNITION: Overall cognitive status: Within functional limits for tasks assessed  SENSATION: Light touch intact BUE    POSTURE: Forward head, rounded shoulders, elevated UT   UPPER EXTREMITY ROM:   A/PROM Right eval Left eval L AROM 09/06/22 L AROM  09/25/22 L AROM 10/04/22 L AROM 10/09/22  Shoulder flexion 168 142 s * 136 s 150 135 145 deg p!  Shoulder abduction 166 136 s * 122 s 135 110 136 deg p!  Shoulder internal rotation (functional) T10 T10 *    T5 painless  Shoulder external rotation (functional) T1 C7 *  T2 T2 T3 painless  Elbow flexion          Elbow extension          Wrist flexion          Wrist extension           (Blank rows = not tested) (Key: WFL = within functional limits not formally assessed, * = concordant pain, s = stiffness/stretching sensation, NT = not tested)  Comments:     UPPER EXTREMITY MMT:   MMT Right eval Left eval L 09/06/22 R/L 10/09/22  Shoulder flexion        Shoulder extension        Shoulder abduction        Shoulder extension        Shoulder internal rotation 5 3+ * 4 mild pain 5/4+  Shoulder external rotation 5 3+ * 4 Mild pain  5/4+ p!  Elbow flexion 5 3+ *  5/4+ p!  Elbow extension 5 3+ *  5/4+ p!  Grip strength        (Blank rows = not tested)  (Key: WFL = within functional limits not formally assessed, * = concordant pain, s =  stiffness/stretching sensation, NT = not tested)  Comments:    SHOULDER SPECIAL TESTS: Deferred given symptom irritability w/ ROM/MMT   PALPATION:  Concordant TTP L bicipital groove, deltoid, and infraspinatus/teres  FUNCTIONAL TESTING: 10/09/22: 5# KB pickup 5 reps, painless 10# KB pickup 5 reps, mild pulling no pain 15# KB pickup 5 reps, increased fatigue               TODAY'S TREATMENT:                                                                                               [facility] Adult PT Treatment:                                                DATE: 10/30/22 Therapeutic Exercise: Biceps curl pyramid 1# x5, 2#x5, 3# x5, 2# x5, 1#x5 DB scaption x8 cues for comfortable ROM and setup DB lat raise x5 cues for comfortable ROM and elbow setup GTB row 2x8 cues for reduced UT compensations and pacing  High>low row 3# at cable column, LUE only x8 cues for reduced compensations at UT and elbow  BIL scaption w/ yoga block for inc IR/add tension, x8 cues for posture  Standing swiss ball scaption x10 cues for  comfortable ROM Standing swiss ball flexion x10 cues for comfortable ROM  Standing shoulder flexion walkbacks at counter x12    [facility] Adult PT Treatment:                                                DATE: 10/26/22 Therapeutic Exercise: Swiss ball flexion rollout, standing at table, x10 cues for form and appropriate ROM  Swiss ball scaption rollout, standing at table, x10 cues for comfortable ROM  Doorway rhomboid stretch, LUE only 2x6 with 5 sec hold cues for comfortable ROM  LS stretch 2x30sec L only cues for form and comfortable ROM  UT stretch 2x30sec L only cues for comfortable ROM  Supine shoulder flexion AAROM w/ dowel 2x8 cues for setup and appropriate ROM    [facility] Adult PT Treatment:                                                DATE: 10/23/22 Therapeutic Exercise: Swiss ball flexion up wall 2x10 cues for form and appropriate ROM Swiss ball RC perturbation 3x30sec at  ~100deg flexion cues for setup BIL scaption w/ RTB 2x8 cues for form and comfortable ROM  Bicep curl 4# 2x6 cues for form and pacing  15# KB front carry 3x75ft cues for form and posture  Shoulder flexion walkback x12    [facility] Adult PT Treatment:                                                DATE: 10/09/22 Therapeutic Exercise: Bicep curl 3# 2x5 cues for pacing  B ER + scapular retraction 2x10 cues for form and posture  Shoulder flexion walkback at counter x12 cues for appropriate ROM and form  Therapeutic Activity: 5# pickup x5 floor<>waist 10# pickup x5 floor<>waist 15# pickup x5 floor<>waist MSK assessment + education Quick DASH + education    PATIENT EDUCATION: Education details: Rationale for interventions Person educated: Patient Education method: Explanation, Demonstration, Tactile cues, Verbal cues, handout Education comprehension: verbalized understanding, returned demonstration, verbal cues required, tactile cues required, and needs further education     HOME EXERCISE PROGRAM: Access Code: [email or web address] URL: [email or web address] Date: 10/23/2022 Prepared by: Fransisco Hertz  Exercises - Standing 'L' Stretch at Counter  - 1 x daily - 7 x weekly - 2 sets - 6 reps - Supine Shoulder Horizontal Abduction with Resistance  - 1 x daily - 7 x weekly - 2 sets - 8 reps - Isometric Shoulder Abduction at Wall  - 1 x daily - 7 x weekly - 2 sets - 10 reps - Standing Shoulder Diagonal Horizontal Abduction 60/120 Degrees with Resistance  - 1 x daily - 7 x weekly - 2 sets - 8 reps - Seated Elbow Flexion with Self-Anchored Resistance  - 1 x daily - 7 x weekly - 2 sets - 10 reps   ASSESSMENT:   CLINICAL IMPRESSION: 10/30/2022 Pt arrives w/ 0/10 pain at rest, up to -3/10 with movement. Did well after last session. Today able to progress for increased time spent with strengthening, introduction of long lever GH/RC strengthening with DB for  scaption/abduction within comfortable  ROM. Some mild transient increases in pain reported (up to 2/10, lateral shoulder) but resolves with rest and stretching at end of session. No adverse events, pt denies any pain on departure. Recommend continuing along current POC in order to address relevant deficits and improve functional tolerance. Pt departs today's session in no acute distress, all voiced questions/concerns addressed appropriately from PT perspective.    Eval - Patient is a pleasant 22 y.o. woman who was seen today for physical therapy evaluation and treatment for L shoulder pain since summer 2023, gradually worsening with exacerbation in Dec 2023. Pt states she has limitations in strength and mobility that are affecting her ability to perform ADLs and work activities, particular difficulty with reaching overhead and lifting heavy items. Symptoms are irritable on examination today but eases back to baseline after a few minutes - pt does demo globally reduced UE MMT (elbow flex/ext and [facility] ER most provocative) and stiffness with GH AROM. Anticipate pt would benefit from skilled PT to address these deficits in order to maximize functional independence/tolerance. No adverse events, pt denies any increase in pain on departure. Pt departs today's session in no acute distress, all voiced questions/concerns addressed appropriately from PT perspective.     OBJECTIVE IMPAIRMENTS: decreased activity tolerance, decreased endurance, decreased mobility, decreased ROM, decreased strength, hypomobility, impaired perceived functional ability, increased muscle spasms, impaired UE functional use, and pain.    ACTIVITY LIMITATIONS: carrying, lifting, sleeping, dressing, reach over head, and hygiene/grooming   PARTICIPATION LIMITATIONS: cleaning, laundry, and occupation   PERSONAL FACTORS: Time since onset of injury/illness/exacerbation are also affecting patient's functional outcome.    REHAB POTENTIAL: Good   CLINICAL DECISION MAKING:  Evolving/moderate complexity   EVALUATION COMPLEXITY: Low     GOALS: Goals reviewed with patient? No   SHORT TERM GOALS: Target date: 09/08/2022 Pt will demonstrate appropriate understanding and performance of initially prescribed HEP in order to facilitate improved independence with management of symptoms.  Baseline: HEP provided on eval 09/06/22: Pt endorses good performance of HEP at home Goal status: MET   2. Pt will score less than or equal to 39% on Quick DASH in order to demonstrate improved perception of function due to symptoms.            Baseline: 47.7%  09/11/22: 19%            Goal status: MET   LONG TERM GOALS: Target date: 11/06/2022 (updated on 10/09/22)   Pt will score less than or equal 30% on Quick DASH in order to demonstrate improved perception of function due to symptoms. Baseline: 47.7% 10/09/22: 11.4% Goal status: MET    2.  Pt will demonstrate at least 160 degrees of active L shoulder elevation in order to demonstrate improved tolerance to reaching overhead.  Baseline: see ROM chart above 10/09/22: see ROM chart above Goal status: ONGOING   3.  Pt will demonstrate at least 4+/5 shoulder L ER/IR MMT for improved symmetry of UE strength and improved tolerance to functional movements.  Baseline: see MMT chart above 10/09/22: see MMT chart above Goal status: PROGRESSING   4. Pt will report/demonstrate ability to perform 30# floor<>waist lift with less than 2 point increase in pain on NPS in order to indicative improved tolerance/independence with work tasks.            Baseline: reports up to 8/10 pain with heavy tasks at work  10/09/22: 15# x5 pickup with increased strain  Goal status: PROGRESSING    PLAN (updated 10/09/22):   PT FREQUENCY: 2x/week   PT DURATION: 4 weeks   PLANNED INTERVENTIONS: Therapeutic exercises, Therapeutic activity, Neuromuscular re-education, Balance training, Gait training, Patient/Family education, Self Care, Joint  mobilization, Joint manipulation, Dry Needling, Electrical stimulation, Spinal manipulation, Spinal mobilization, Cryotherapy, Moist heat, Taping, Manual therapy, and Re-evaluation   PLAN FOR NEXT SESSION:  update HEP. Plan to progress for more functional strengths/carrying next session if able  Ashley Murrain PT, DPT 10/30/2022 5:22 PM

## 2022-11-01 NOTE — Therapy (Signed)
OUTPATIENT PHYSICAL THERAPY TREATMENT NOTE   Patient Name: Denise Hendrix MRN: [id number] DOB:10-Sep-2000, 22 y.o., female Today's Date: 11/02/2022      PCP: Paschal Dopp, PA   REFERRING PROVIDER: Julieanne Cotton, PA-C  END OF SESSION:   PT End of Session - 11/02/22 1630     Visit Number 15    Number of Visits 17    Date for PT Re-Evaluation 12/04/22    Authorization Type [facility] [employer] [employer]    Authorization Time Period no auth first 27 visits    PT Start Time 1630    PT Stop Time 1716    PT Time Calculation (min) 46 min    Activity Tolerance Patient tolerated treatment well;No increased pain    Behavior During Therapy [facility] for tasks assessed/performed                History reviewed. No pertinent past medical history. History reviewed. No pertinent surgical history. There are no problems to display for this patient.   REFERRING DIAG: M25.512 (ICD-10-CM) - Left shoulder pain, unspecified chronicity   THERAPY DIAG:  Left shoulder pain, unspecified chronicity  Muscle weakness (generalized)  Stiffness of left shoulder, not elsewhere classified  Rationale for Evaluation and Treatment Rehabilitation  PERTINENT HISTORY: Unremarkable PMH   PRECAUTIONS: none  SUBJECTIVE:                                                                                                                                                                                      SUBJECTIVE STATEMENT:   Pt arrives w/ 2/10 pain. States her shoulder has been feeling a bit more tense since flare up last week. Notes bicep curls have been quickly fatiguing    PAIN:  Are you having pain: 2/10 at rest  Location: L anterior/superior shoulder  How would you describe your pain? "Like a grip" Best in past week: 0/10 Worst in past week: 4/10 ( on eval 7-8/10 usually eases within a few minutes, sometimes hours) Aggravating factors: lifting, reaching overhead, laundry, reaching into cabinet,  sleeping Easing factors: heat, medication, ointment   Next MD visit: Beginning of May   OBJECTIVE: (objective measures completed at initial evaluation unless otherwise dated)   DIAGNOSTIC FINDINGS:  MRI per [facility] 07/26/22 IMPRESSION: Small nondisplaced tear of the posteroinferior labrum from 7:00-8:00.   Focal irregularity of the anterior superior labrum at 1:00 with undercutting contrast from 12:00-1:00, favored to represent a small additional nondisplaced tear, versus normal variant.   No evidence of rotator cuff tear.   PATIENT SURVEYS:  QuickDASH: 47.7% 09/11/22:  15.9%  10/09/22: 11.4 %  COGNITION: Overall cognitive status: Within functional limits for tasks  assessed                                  SENSATION: Light touch intact BUE    POSTURE: Forward head, rounded shoulders, elevated UT   UPPER EXTREMITY ROM:   A/PROM Right eval Left eval L AROM 09/06/22 L AROM  09/25/22 L AROM 10/04/22 L AROM 10/09/22 L A/pROM 11/02/22  Shoulder flexion 168 142 s * 136 s 150 135 145 deg p! 140 / 160 deg   Shoulder abduction 166 136 s * 122 s 135 110 136 deg p! 120 deg s  Shoulder internal rotation (functional) T10 T10 *    T5 painless   Shoulder external rotation (functional) T1 C7 *  T2 T2 T3 painless   Elbow flexion           Elbow extension           Wrist flexion           Wrist extension            (Blank rows = not tested) (Key: WFL = within functional limits not formally assessed, * = concordant pain, s = stiffness/stretching sensation, NT = not tested)  Comments:     UPPER EXTREMITY MMT:   MMT Right eval Left eval L 09/06/22 R/L 10/09/22 R/L 11/02/22  Shoulder flexion         Shoulder extension         Shoulder abduction         Shoulder extension         Shoulder internal rotation 5 3+ * 4 mild pain 5/4+ 5/4+ p!  Shoulder external rotation 5 3+ * 4 Mild pain  5/4+ p! 5/5 p!  Elbow flexion 5 3+ *  5/4+ p! 5/5  Elbow extension 5 3+ *  5/4+ p! 5/5  Grip strength          (Blank rows = not tested)  (Key: WFL = within functional limits not formally assessed, * = concordant pain, s = stiffness/stretching sensation, NT = not tested)  Comments:    SHOULDER SPECIAL TESTS: Deferred given symptom irritability w/ ROM/MMT   PALPATION:  Concordant TTP L bicipital groove, deltoid, and infraspinatus/teres  FUNCTIONAL TESTING: 10/09/22: 5# KB pickup 5 reps, painless 10# KB pickup 5 reps, mild pulling no pain 15# KB pickup 5 reps, increased fatigue               TODAY'S TREATMENT:                                                                                               [facility] Adult PT Treatment:                                                DATE: 11/02/22 Therapeutic Exercise: Biceps curl 4# 2x6 cues for pacing  Counter plank 3x45 sec cues for form and posture  GTB row 3x8 cues for reduced UT compensations and form  RTB B ER + scaption 2x8 cues for form and posture  Swiss ball flexion x12 cues for comfortable ROM  HEP update + handout, education on relevant anatomy/physiology as it relates to exercise   [facility] Adult PT Treatment:                                                DATE: 10/30/22 Therapeutic Exercise: Biceps curl pyramid 1# x5, 2#x5, 3# x5, 2# x5, 1#x5 DB scaption x8 cues for comfortable ROM and setup DB lat raise x5 cues for comfortable ROM and elbow setup GTB row 2x8 cues for reduced UT compensations and pacing  High>low row 3# at cable column, LUE only x8 cues for reduced compensations at UT and elbow  BIL scaption w/ yoga block for inc IR/add tension, x8 cues for posture  Standing swiss ball scaption x10 cues for comfortable ROM Standing swiss ball flexion x10 cues for comfortable ROM  Standing shoulder flexion walkbacks at counter x12    [facility] Adult PT Treatment:                                                DATE: 10/26/22 Therapeutic Exercise: Swiss ball flexion rollout, standing at table, x10 cues for form and appropriate ROM  Swiss  ball scaption rollout, standing at table, x10 cues for comfortable ROM  Doorway rhomboid stretch, LUE only 2x6 with 5 sec hold cues for comfortable ROM  LS stretch 2x30sec L only cues for form and comfortable ROM  UT stretch 2x30sec L only cues for comfortable ROM  Supine shoulder flexion AAROM w/ dowel 2x8 cues for setup and appropriate ROM    [facility] Adult PT Treatment:                                                DATE: 10/23/22 Therapeutic Exercise: Swiss ball flexion up wall 2x10 cues for form and appropriate ROM Swiss ball RC perturbation 3x30sec at ~100deg flexion cues for setup BIL scaption w/ RTB 2x8 cues for form and comfortable ROM  Bicep curl 4# 2x6 cues for form and pacing  15# KB front carry 3x41ft cues for form and posture  Shoulder flexion walkback x12       PATIENT EDUCATION: Education details: Rationale for interventions, HEP update Person educated: Patient Education method: Explanation, Demonstration, Tactile cues, Verbal cues, handout Education comprehension: verbalized understanding, returned demonstration, verbal cues required, tactile cues required, and needs further education     HOME EXERCISE PROGRAM: Access Code: [email or web address] URL: [email or web address] Date: 11/02/2022 Prepared by: Fransisco Hertz  Exercises - Standing 'L' Stretch at Counter  - 1 x daily - 7 x weekly - 2 sets - 6 reps - Supine Shoulder Horizontal Abduction with Resistance  - 1 x daily - 7 x weekly - 2 sets - 8 reps - Isometric Shoulder Abduction at Wall  - 1 x daily - 7 x weekly - 2 sets - 10 reps - Standing Shoulder Row with Anchored Resistance  - 1 x daily - 7 x weekly -  2 sets - 10 reps   ASSESSMENT:   CLINICAL IMPRESSION: 11/02/2022 Pt arrives w/ 2/10 pain, states her shoulder has been feeling more tense the past week or so but no increase in sharp pain, clicking, or popping. Today progression limited by report of muscle fatigue and tension in shoulder, mild transient increases  in pain but pt endorses improved symptoms as session goes on. Measured AROM throughout session as marker of muscle fatigue - pt ability to achieve 140 deg pre treatment and gradually reduces to 120/110deg limited by "tension" rather than pain, although pt is able to achieve 160 deg passively at end of session. Pt departs with report of 0/10 pain on NPS, HEP updated as above. Pt departs today's session in no acute distress, all voiced questions/concerns addressed appropriately from PT perspective.     Eval - Patient is a pleasant 22 y.o. woman who was seen today for physical therapy evaluation and treatment for L shoulder pain since summer 2023, gradually worsening with exacerbation in Dec 2023. Pt states she has limitations in strength and mobility that are affecting her ability to perform ADLs and work activities, particular difficulty with reaching overhead and lifting heavy items. Symptoms are irritable on examination today but eases back to baseline after a few minutes - pt does demo globally reduced UE MMT (elbow flex/ext and [facility] ER most provocative) and stiffness with GH AROM. Anticipate pt would benefit from skilled PT to address these deficits in order to maximize functional independence/tolerance. No adverse events, pt denies any increase in pain on departure. Pt departs today's session in no acute distress, all voiced questions/concerns addressed appropriately from PT perspective.     OBJECTIVE IMPAIRMENTS: decreased activity tolerance, decreased endurance, decreased mobility, decreased ROM, decreased strength, hypomobility, impaired perceived functional ability, increased muscle spasms, impaired UE functional use, and pain.    ACTIVITY LIMITATIONS: carrying, lifting, sleeping, dressing, reach over head, and hygiene/grooming   PARTICIPATION LIMITATIONS: cleaning, laundry, and occupation   PERSONAL FACTORS: Time since onset of injury/illness/exacerbation are also affecting patient's functional  outcome.    REHAB POTENTIAL: Good   CLINICAL DECISION MAKING: Evolving/moderate complexity   EVALUATION COMPLEXITY: Low     GOALS: Goals reviewed with patient? No   SHORT TERM GOALS: Target date: 09/08/2022 Pt will demonstrate appropriate understanding and performance of initially prescribed HEP in order to facilitate improved independence with management of symptoms.  Baseline: HEP provided on eval 09/06/22: Pt endorses good performance of HEP at home Goal status: MET   2. Pt will score less than or equal to 39% on Quick DASH in order to demonstrate improved perception of function due to symptoms.            Baseline: 47.7%  09/11/22: 19%            Goal status: MET   LONG TERM GOALS: Target date: 11/06/2022 (updated on 10/09/22)   Pt will score less than or equal 30% on Quick DASH in order to demonstrate improved perception of function due to symptoms. Baseline: 47.7% 10/09/22: 11.4% Goal status: MET    2.  Pt will demonstrate at least 160 degrees of active L shoulder elevation in order to demonstrate improved tolerance to reaching overhead.  Baseline: see ROM chart above 10/09/22: see ROM chart above Goal status: ONGOING   3.  Pt will demonstrate at least 4+/5 shoulder L ER/IR MMT for improved symmetry of UE strength and improved tolerance to functional movements.  Baseline: see MMT chart above 10/09/22: see MMT  chart above Goal status: PROGRESSING   4. Pt will report/demonstrate ability to perform 30# floor<>waist lift with less than 2 point increase in pain on NPS in order to indicative improved tolerance/independence with work tasks.            Baseline: reports up to 8/10 pain with heavy tasks at work  10/09/22: 15# x5 pickup with increased strain            Goal status: PROGRESSING    PLAN (updated 10/09/22):   PT FREQUENCY: 2x/week   PT DURATION: 4 weeks   PLANNED INTERVENTIONS: Therapeutic exercises, Therapeutic activity, Neuromuscular re-education, Balance  training, Gait training, Patient/Family education, Self Care, Joint mobilization, Joint manipulation, Dry Needling, Electrical stimulation, Spinal manipulation, Spinal mobilization, Cryotherapy, Moist heat, Taping, Manual therapy, and Re-evaluation   PLAN FOR NEXT SESSION:  Plan to progress for more functional strengths/carrying next session if able    Ashley Murrain PT, DPT 11/02/2022 5:39 PM

## 2022-11-02 ENCOUNTER — Encounter: Payer: Self-pay | Admitting: Physical Therapy

## 2022-11-02 ENCOUNTER — Ambulatory Visit: Payer: Medicaid Other | Admitting: Physical Therapy

## 2022-11-02 DIAGNOSIS — M6281 Muscle weakness (generalized): Secondary | ICD-10-CM

## 2022-11-02 DIAGNOSIS — M25612 Stiffness of left shoulder, not elsewhere classified: Secondary | ICD-10-CM

## 2022-11-02 DIAGNOSIS — M25512 Pain in left shoulder: Secondary | ICD-10-CM | POA: Diagnosis not present

## 2022-11-03 NOTE — Therapy (Signed)
OUTPATIENT PHYSICAL THERAPY TREATMENT NOTE   Patient Name: Denise Hendrix MRN: [id number] DOB:[birth year], 22 y.o., female Today's Date: 11/06/2022      PCP: Paschal Dopp, PA   REFERRING PROVIDER: Julieanne Cotton, PA-C  END OF SESSION:   PT End of Session - 11/06/22 1629     Visit Number 16    Number of Visits 17    Date for PT Re-Evaluation 12/04/22    Authorization Type [facility] [employer]    Authorization Time Period no auth first 27 visits    PT Start Time 1630    PT Stop Time 1720   moist heat unbilled   PT Time Calculation (min) 50 min    Activity Tolerance Patient tolerated treatment well;No increased pain    Behavior During Therapy [facility] for tasks assessed/performed                 History reviewed. No pertinent past medical history. History reviewed. No pertinent surgical history. There are no problems to display for this patient.   REFERRING DIAG: M25.512 (ICD-10-CM) - Left shoulder pain, unspecified chronicity   THERAPY DIAG:  Left shoulder pain, unspecified chronicity  Muscle weakness (generalized)  Stiffness of left shoulder, not elsewhere classified  Rationale for Evaluation and Treatment Rehabilitation  PERTINENT HISTORY: Unremarkable PMH   PRECAUTIONS: none  SUBJECTIVE:                                                                                                                                                                                      SUBJECTIVE STATEMENT:   Pt arrives w/ 5/10 pain she attributes to having to slam on her brakes just before session, no collisions. No bruising noted. Felt good after last session with some fatigue/soreness, minimal pain over weekend.  Continues to have flareups based on activity.     PAIN:  Are you having pain: 5/10 at rest  Location: L anterior/superior shoulder  How would you describe your pain? "Like a grip" Best in past week: 0/10 Worst in past week: 4/10 ( on eval  7-8/10 usually eases within a few minutes, sometimes hours) Aggravating factors: lifting, reaching overhead, laundry, reaching into cabinet, sleeping Easing factors: heat, medication, ointment   Next MD visit: Beginning of May   OBJECTIVE: (objective measures completed at initial evaluation unless otherwise dated)   DIAGNOSTIC FINDINGS:  MRI per [facility] 07/26/22 IMPRESSION: Small nondisplaced tear of the posteroinferior labrum from 7:00-8:00.   Focal irregularity of the anterior superior labrum at 1:00 with undercutting contrast from 12:00-1:00, favored to represent a small additional nondisplaced tear, versus normal variant.   No evidence of rotator cuff tear.  PATIENT SURVEYS:  QuickDASH: 47.7% 09/11/22:  15.9%  10/09/22: 11.4 %  COGNITION: Overall cognitive status: Within functional limits for tasks assessed                                  SENSATION: Light touch intact BUE    POSTURE: Forward head, rounded shoulders, elevated UT   UPPER EXTREMITY ROM:   A/PROM Right eval Left eval L AROM 09/06/22 L AROM  09/25/22 L AROM 10/04/22 L AROM 10/09/22 L A/pROM 11/02/22  Shoulder flexion 168 142 s * 136 s 150 135 145 deg p! 140 / 160 deg   Shoulder abduction 166 136 s * 122 s 135 110 136 deg p! 120 deg s  Shoulder internal rotation (functional) T10 T10 *    T5 painless   Shoulder external rotation (functional) T1 C7 *  T2 T2 T3 painless   Elbow flexion           Elbow extension           Wrist flexion           Wrist extension            (Blank rows = not tested) (Key: WFL = within functional limits not formally assessed, * = concordant pain, s = stiffness/stretching sensation, NT = not tested)  Comments:     UPPER EXTREMITY MMT:   MMT Right eval Left eval L 09/06/22 R/L 10/09/22 R/L 11/02/22  Shoulder flexion         Shoulder extension         Shoulder abduction         Shoulder extension         Shoulder internal rotation 5 3+ * 4 mild pain 5/4+ 5/4+ p!  Shoulder  external rotation 5 3+ * 4 Mild pain  5/4+ p! 5/5 p!  Elbow flexion 5 3+ *  5/4+ p! 5/5  Elbow extension 5 3+ *  5/4+ p! 5/5  Grip strength         (Blank rows = not tested)  (Key: WFL = within functional limits not formally assessed, * = concordant pain, s = stiffness/stretching sensation, NT = not tested)  Comments:    SHOULDER SPECIAL TESTS: Deferred given symptom irritability w/ ROM/MMT   PALPATION:  Concordant TTP L bicipital groove, deltoid, and infraspinatus/teres  FUNCTIONAL TESTING: 10/09/22: 5# KB pickup 5 reps, painless 10# KB pickup 5 reps, mild pulling no pain 15# KB pickup 5 reps, increased fatigue               TODAY'S TREATMENT:                                                                                               [facility] Adult PT Treatment:  DATE: 11/06/22 Therapeutic Exercise: Swiss ball GH flexion up wall 2x8 cues for comfortable ROM  Supine GH flexion AAROM 2x8 cues for comfortable ROM  Doorway short lever pec stretch 3x30sec cues for comfortable ROM, setup Seated elbow flex/ext x20 cues for pacing  Pendulums 2x30 sec, fwd/back sways 2x30sec, med/lat sways 2x30sec Increased rest breaks required d/t symptom irritability/fatigue  Modalities: Moist heat seated, L shoulder no adverse events   [facility] Adult PT Treatment:                                                DATE: 11/02/22 Therapeutic Exercise: Biceps curl 4# 2x6 cues for pacing  Counter plank 3x45 sec cues for form and posture GTB row 3x8 cues for reduced UT compensations and form  RTB B ER + scaption 2x8 cues for form and posture  Swiss ball flexion x12 cues for comfortable ROM  HEP update + handout, education on relevant anatomy/physiology as it relates to exercise   [facility] Adult PT Treatment:                                                DATE: 10/30/22 Therapeutic Exercise: Biceps curl pyramid 1# x5, 2#x5, 3# x5, 2# x5, 1#x5 DB scaption x8 cues  for comfortable ROM and setup DB lat raise x5 cues for comfortable ROM and elbow setup GTB row 2x8 cues for reduced UT compensations and pacing  High>low row 3# at cable column, LUE only x8 cues for reduced compensations at UT and elbow  BIL scaption w/ yoga block for inc IR/add tension, x8 cues for posture  Standing swiss ball scaption x10 cues for comfortable ROM Standing swiss ball flexion x10 cues for comfortable ROM  Standing shoulder flexion walkbacks at counter x12      PATIENT EDUCATION: Education details: Rationale for interventions, HEP review Person educated: Patient Education method: Explanation, Demonstration, Tactile cues, Verbal cues, handout Education comprehension: verbalized understanding, returned demonstration, verbal cues required, tactile cues required, and needs further education     HOME EXERCISE PROGRAM: Access Code: [email or web address] URL: [email or web address] Date: 11/02/2022 Prepared by: Fransisco Hertz  Exercises - Standing 'L' Stretch at Counter  - 1 x daily - 7 x weekly - 2 sets - 6 reps - Supine Shoulder Horizontal Abduction with Resistance  - 1 x daily - 7 x weekly - 2 sets - 8 reps - Isometric Shoulder Abduction at Wall  - 1 x daily - 7 x weekly - 2 sets - 10 reps - Standing Shoulder Row with Anchored Resistance  - 1 x daily - 7 x weekly - 2 sets - 10 reps   ASSESSMENT:   CLINICAL IMPRESSION: 11/06/2022 Pt arrives w/ 5/10 pain on NPS she attributes to slamming on brakes prior to session - no bruising evident, she notes that this feels comparable to her normal flareups. Regressed program today given increased symptom irritability, pt requiring increased rest breaks, session focusing on maximizing comfort with ROM and increasing tissue extensibility. Pt with intermittent transient discomfort during activity but describes mild improvement in symptoms as session goes on, 4/10. Requests moist heat at end of session which is applied without adverse  events, pain remains at 4/10 but pt endorses improved fatigue/stiffness. No  adverse events. At present, will plan for tentative discharge next session to further provider follow up, as pt has noted some improvements compared to start of care but continues to have significant fluctuations. Pt departs today's session in no acute distress, all voiced questions/concerns addressed appropriately from PT perspective.     Eval - Patient is a pleasant 22 y.o. woman who was seen today for physical therapy evaluation and treatment for L shoulder pain since summer 2023, gradually worsening with exacerbation in Dec 2023. Pt states she has limitations in strength and mobility that are affecting her ability to perform ADLs and work activities, particular difficulty with reaching overhead and lifting heavy items. Symptoms are irritable on examination today but eases back to baseline after a few minutes - pt does demo globally reduced UE MMT (elbow flex/ext and [facility] ER most provocative) and stiffness with GH AROM. Anticipate pt would benefit from skilled PT to address these deficits in order to maximize functional independence/tolerance. No adverse events, pt denies any increase in pain on departure. Pt departs today's session in no acute distress, all voiced questions/concerns addressed appropriately from PT perspective.     OBJECTIVE IMPAIRMENTS: decreased activity tolerance, decreased endurance, decreased mobility, decreased ROM, decreased strength, hypomobility, impaired perceived functional ability, increased muscle spasms, impaired UE functional use, and pain.    ACTIVITY LIMITATIONS: carrying, lifting, sleeping, dressing, reach over head, and hygiene/grooming   PARTICIPATION LIMITATIONS: cleaning, laundry, and occupation   PERSONAL FACTORS: Time since onset of injury/illness/exacerbation are also affecting patient's functional outcome.    REHAB POTENTIAL: Good   CLINICAL DECISION MAKING: Evolving/moderate  complexity   EVALUATION COMPLEXITY: Low     GOALS: Goals reviewed with patient? No   SHORT TERM GOALS: Target date: 09/08/2022 Pt will demonstrate appropriate understanding and performance of initially prescribed HEP in order to facilitate improved independence with management of symptoms.  Baseline: HEP provided on eval 09/06/22: Pt endorses good performance of HEP at home Goal status: MET   2. Pt will score less than or equal to 39% on Quick DASH in order to demonstrate improved perception of function due to symptoms.            Baseline: 47.7%  09/11/22: 19%            Goal status: MET   LONG TERM GOALS: Target date: 11/06/2022 (updated on 10/09/22)   Pt will score less than or equal 30% on Quick DASH in order to demonstrate improved perception of function due to symptoms. Baseline: 47.7% 10/09/22: 11.4% Goal status: MET    2.  Pt will demonstrate at least 160 degrees of active L shoulder elevation in order to demonstrate improved tolerance to reaching overhead.  Baseline: see ROM chart above 10/09/22: see ROM chart above Goal status: ONGOING   3.  Pt will demonstrate at least 4+/5 shoulder L ER/IR MMT for improved symmetry of UE strength and improved tolerance to functional movements.  Baseline: see MMT chart above 10/09/22: see MMT chart above Goal status: PROGRESSING   4. Pt will report/demonstrate ability to perform 30# floor<>waist lift with less than 2 point increase in pain on NPS in order to indicative improved tolerance/independence with work tasks.            Baseline: reports up to 8/10 pain with heavy tasks at work  10/09/22: 15# x5 pickup with increased strain            Goal status: PROGRESSING    PLAN (updated 10/09/22):   PT  FREQUENCY: 2x/week   PT DURATION: 4 weeks   PLANNED INTERVENTIONS: Therapeutic exercises, Therapeutic activity, Neuromuscular re-education, Balance training, Gait training, Patient/Family education, Self Care, Joint mobilization, Joint  manipulation, Dry Needling, Electrical stimulation, Spinal manipulation, Spinal mobilization, Cryotherapy, Moist heat, Taping, Manual therapy, and Re-evaluation   PLAN FOR NEXT SESSION:  plan for tentative d/c next session to further medical follow up  Ashley Murrain PT, DPT 11/06/2022 5:27 PM

## 2022-11-06 ENCOUNTER — Encounter: Payer: Self-pay | Admitting: Physical Therapy

## 2022-11-06 ENCOUNTER — Ambulatory Visit: Payer: Medicaid Other | Admitting: Physical Therapy

## 2022-11-06 DIAGNOSIS — M6281 Muscle weakness (generalized): Secondary | ICD-10-CM

## 2022-11-06 DIAGNOSIS — M25512 Pain in left shoulder: Secondary | ICD-10-CM

## 2022-11-06 DIAGNOSIS — M25612 Stiffness of left shoulder, not elsewhere classified: Secondary | ICD-10-CM

## 2022-11-09 ENCOUNTER — Ambulatory Visit: Payer: Medicaid Other | Admitting: Physical Therapy

## 2022-11-09 ENCOUNTER — Encounter: Payer: Self-pay | Admitting: Physical Therapy

## 2022-11-09 DIAGNOSIS — M25612 Stiffness of left shoulder, not elsewhere classified: Secondary | ICD-10-CM

## 2022-11-09 DIAGNOSIS — M25512 Pain in left shoulder: Secondary | ICD-10-CM | POA: Diagnosis not present

## 2022-11-09 DIAGNOSIS — M6281 Muscle weakness (generalized): Secondary | ICD-10-CM

## 2022-11-09 NOTE — Therapy (Signed)
OUTPATIENT PHYSICAL THERAPY TREATMENT NOTE + DISCHARGE SUMMARY   Patient Name: Denise Hendrix MRN: [id number] DOB:Dec 07, 2000, 22 y.o., female Today's Date: 11/09/2022  PHYSICAL THERAPY DISCHARGE SUMMARY  Visits from Start of Care: 17  Current functional level related to goals / functional outcomes: Limited in heavy lifting and work activities per pt report - endorses difficulty with home activities varying based on symptom fluctuation   Remaining deficits: Reduced GH mobility, GH flexion weakness, pain   Education / Equipment: HEP, discharge education, follow up with provider   Patient agrees to discharge. Patient goals were partially met. Patient is being discharged due to  continued fluctuations in pain and plateau in progress.     PCP: Paschal Dopp, PA   REFERRING PROVIDER: Julieanne Cotton, PA-C  END OF SESSION:   PT End of Session - 11/09/22 1629     Visit Number 17    Number of Visits 17    Date for PT Re-Evaluation 12/04/22    Authorization Type [facility] [employer]    Authorization Time Period no auth first 27 visits    PT Start Time 1631    PT Stop Time 1720   moist heat unbilled   PT Time Calculation (min) 49 min    Activity Tolerance Patient tolerated treatment well    Behavior During Therapy [facility] for tasks assessed/performed               History reviewed. No pertinent past medical history. History reviewed. No pertinent surgical history. There are no problems to display for this patient.   REFERRING DIAG: M25.512 (ICD-10-CM) - Left shoulder pain, unspecified chronicity   THERAPY DIAG:  Left shoulder pain, unspecified chronicity  Muscle weakness (generalized)  Stiffness of left shoulder, not elsewhere classified  Rationale for Evaluation and Treatment Rehabilitation  PERTINENT HISTORY: Unremarkable PMH   PRECAUTIONS: none  SUBJECTIVE:                                                                                                                                                                                       SUBJECTIVE STATEMENT:   Pt states she has 2/10 pain at present but 6/10 back pain she attributes to work activities. Pt states she continues to have significant fluctuations in pain based on activity. States activities at home tend to do well in between flareups. States that since starting therapy she feels she can move her shoulder more but continues with pain.    PAIN:  Are you having pain: 2/10 at rest (shoulder, mid back pain 6/10) Location: L anterior/superior shoulder  How would you describe your pain? "Like a grip" Best in past week:  0/10 Worst in past week: 5/10 ( on eval 7-8/10 usually eases within a few minutes, sometimes hours) Aggravating factors: lifting, reaching overhead, laundry, reaching into cabinet, sleeping Easing factors: heat, medication, ointment   Next MD visit: Beginning of May per pt, TBD    OBJECTIVE: (objective measures completed at initial evaluation unless otherwise dated)   DIAGNOSTIC FINDINGS:  MRI per [facility] 07/26/22 IMPRESSION: Small nondisplaced tear of the posteroinferior labrum from 7:00-8:00.   Focal irregularity of the anterior superior labrum at 1:00 with undercutting contrast from 12:00-1:00, favored to represent a small additional nondisplaced tear, versus normal variant.   No evidence of rotator cuff tear.   PATIENT SURVEYS:  QuickDASH: 47.7% 09/11/22:  15.9%  10/09/22: 11.4 % QuickDASH 11/09/22: 15.9%   COGNITION: Overall cognitive status: Within functional limits for tasks assessed                                  SENSATION: Light touch intact BUE    POSTURE: Forward head, rounded shoulders, elevated UT   UPPER EXTREMITY ROM:   A/PROM Right eval Left eval L AROM 09/06/22 L AROM  09/25/22 L AROM 10/04/22 L AROM 10/09/22 L A/pROM 11/02/22 L AROM 11/09/22    Shoulder flexion 168 142 s * 136 s 150 135 145 deg p! 140 / 160 deg  120deg active   Shoulder abduction 166 136 s * 122 s 135 110 136 deg p! 120 deg s 112 deg active  Shoulder internal rotation (functional) T10 T10 *    T5 painless    Shoulder external rotation (functional) T1 C7 *  T2 T2 T3 painless    Elbow flexion            Elbow extension            Wrist flexion            Wrist extension             (Blank rows = not tested) (Key: WFL = within functional limits not formally assessed, * = concordant pain, s = stiffness/stretching sensation, NT = not tested)  Comments:     UPPER EXTREMITY MMT:   MMT Right eval Left eval L 09/06/22 R/L 10/09/22 R/L 11/02/22 R/L 11/09/22  Shoulder flexion          Shoulder extension          Shoulder abduction          Shoulder extension          Shoulder internal rotation 5 3+ * 4 mild pain 5/4+ 5/4+ p! 5/4+ p!   Shoulder external rotation 5 3+ * 4 Mild pain  5/4+ p! 5/5 p! 5/5  Elbow flexion 5 3+ *  5/4+ p! 5/5 5/5  Elbow extension 5 3+ *  5/4+ p! 5/5 5/5  Grip strength          (Blank rows = not tested)  (Key: WFL = within functional limits not formally assessed, * = concordant pain, s = stiffness/stretching sensation, NT = not tested)  Comments:    SHOULDER SPECIAL TESTS: Deferred given symptom irritability w/ ROM/MMT   PALPATION:  Concordant TTP L bicipital groove, deltoid, and infraspinatus/teres  FUNCTIONAL TESTING: 10/09/22: 5# KB pickup 5 reps, painless 10# KB pickup 5 reps, mild pulling no pain 15# KB pickup 5 reps, increased fatigue   11/09/22: 15# KB pickup 5 reps no pain 20# KB pickup 1  rep inc pain               TODAY'S TREATMENT:                                                                                               [facility] Adult PT Treatment:                                                DATE: 11/09/22 Therapeutic Exercise: RTB row x10 cues for elbow mechanics to mitigate anterior stress on shoulder GH abduction isometrics x5 with 3 sec hold Horizontal abduction RTB x3 cues for setup but  discontinued d/t discomfort Standing GH flexion x10 at counter cues for form HEP review + education  Therapeutic Activity: Msk assessment + education QuickDASH + education Discharge education, activity modification based on symptom response, pacing of activities, follow up with provider Modalities: Moist heat L shoulder with good relief, no adverse events      PATIENT EDUCATION: Education details: Rationale for interventions, HEP review, discharge education, follow up with provider Person educated: Patient Education method: Explanation, Demonstration, Tactile cues, Verbal cues, handout Education comprehension: verbalized understanding, returned demonstration, verbal cues required, tactile cues required, and needs further education     HOME EXERCISE PROGRAM: Access Code: [email or web address] URL: [email or web address] Date: 11/09/2022 Prepared by: Fransisco Hertz  Exercises - Standing 'L' Stretch at Counter  - 1 x daily - 7 x weekly - 2 sets - 6 reps - Supine Shoulder Horizontal Abduction with Resistance  - 1 x daily - 7 x weekly - 2 sets - 8 reps - Isometric Shoulder Abduction at Wall  - 1 x daily - 7 x weekly - 2 sets - 10 reps - Standing Shoulder Row with Anchored Resistance  - 1 x daily - 7 x weekly - 2 sets - 10 reps   ASSESSMENT:   CLINICAL IMPRESSION: 11/09/2022 Pt arrives w/ 2/10 shoulder pain and 6/10 back pain she attributes to lifting at work. Overall since start of PT she endorses improved shoulder mobility although she continues to have significant fluctuations in symptoms based on activity. GH strength has consistently improved since start of care, although GH mobility and exercise tolerance has been variable based on exacerbations that she tends to attribute to heavy lifting at work. Given limited ability to progress more functional/strength oriented exercise since start of care with frequent exacerbations, recommend following back up with ortho provider to assess  current status. Pending follow up with provider may benefit from further therapy, but at present recommend discharge to independent HEP. Pt verbalizes agreement/understanding with this plan, denies any questions/concerns. No adverse events, pt reports relief from moist heat post session despite some increased irritability with HEP today given increased symptoms on arrival.   Eval - Patient is a pleasant 22 y.o. woman who was seen today for physical therapy evaluation and treatment for L shoulder pain since summer 2023, gradually worsening with exacerbation in Dec 2023. Pt states she has limitations in  strength and mobility that are affecting her ability to perform ADLs and work activities, particular difficulty with reaching overhead and lifting heavy items. Symptoms are irritable on examination today but eases back to baseline after a few minutes - pt does demo globally reduced UE MMT (elbow flex/ext and [facility] ER most provocative) and stiffness with GH AROM. Anticipate pt would benefit from skilled PT to address these deficits in order to maximize functional independence/tolerance. No adverse events, pt denies any increase in pain on departure. Pt departs today's session in no acute distress, all voiced questions/concerns addressed appropriately from PT perspective.     OBJECTIVE IMPAIRMENTS: decreased activity tolerance, decreased endurance, decreased mobility, decreased ROM, decreased strength, hypomobility, impaired perceived functional ability, increased muscle spasms, impaired UE functional use, and pain.    ACTIVITY LIMITATIONS: carrying, lifting, sleeping, dressing, reach over head, and hygiene/grooming   PARTICIPATION LIMITATIONS: cleaning, laundry, and occupation   PERSONAL FACTORS: Time since onset of injury/illness/exacerbation are also affecting patient's functional outcome.    REHAB POTENTIAL: Good   CLINICAL DECISION MAKING: Evolving/moderate complexity   EVALUATION COMPLEXITY: Low      GOALS: Goals reviewed with patient? No   SHORT TERM GOALS: Target date: 09/08/2022 Pt will demonstrate appropriate understanding and performance of initially prescribed HEP in order to facilitate improved independence with management of symptoms.  Baseline: HEP provided on eval 09/06/22: Pt endorses good performance of HEP at home Goal status: MET   2. Pt will score less than or equal to 39% on Quick DASH in order to demonstrate improved perception of function due to symptoms.            Baseline: 47.7%  09/11/22: 19%            Goal status: MET   LONG TERM GOALS: Target date: 11/06/2022 (updated on 10/09/22)   Pt will score less than or equal 30% on Quick DASH in order to demonstrate improved perception of function due to symptoms. Baseline: 47.7% 10/09/22: 11.4% 11/09/22: 15.9% Goal status: MET    2.  Pt will demonstrate at least 160 degrees of active L shoulder elevation in order to demonstrate improved tolerance to reaching overhead.  Baseline: see ROM chart above 10/09/22: see ROM chart above 11/09/22: 120 deg actively today  Goal status: NOT MET   3.  Pt will demonstrate at least 4+/5 shoulder L ER/IR MMT for improved symmetry of UE strength and improved tolerance to functional movements.  Baseline: see MMT chart above 10/09/22: see MMT chart above 11/09/22: 5/5 ER/IR MMT LUE  Goal status: MET   4. Pt will report/demonstrate ability to perform 30# floor<>waist lift with less than 2 point increase in pain on NPS in order to indicative improved tolerance/independence with work tasks.            Baseline: reports up to 8/10 pain with heavy tasks at work  10/09/22: 15# x5 pickup with increased strain  11/09/22: 20# w/ increased pain             Goal status: NOT MET   PLAN: DISCHARGE 11/09/22   PT FREQUENCY: NA   PT DURATION: NA   PLANNED INTERVENTIONS: Therapeutic exercises, Therapeutic activity, Neuromuscular re-education, Balance training, Gait training, Patient/Family  education, Self Care, Joint mobilization, Joint manipulation, Dry Needling, Electrical stimulation, Spinal manipulation, Spinal mobilization, Cryotherapy, Moist heat, Taping, Manual therapy, and Re-evaluation   PLAN FOR NEXT SESSION:  discharge to independent HEP, follow up with provider  Ashley Murrain PT, DPT 11/09/2022 5:32 PM

## 2022-12-04 ENCOUNTER — Ambulatory Visit (INDEPENDENT_AMBULATORY_CARE_PROVIDER_SITE_OTHER): Payer: Medicaid Other | Admitting: Surgical

## 2022-12-04 DIAGNOSIS — M25512 Pain in left shoulder: Secondary | ICD-10-CM

## 2022-12-04 DIAGNOSIS — S43432D Superior glenoid labrum lesion of left shoulder, subsequent encounter: Secondary | ICD-10-CM | POA: Diagnosis not present

## 2022-12-05 ENCOUNTER — Encounter: Payer: Self-pay | Admitting: Surgical

## 2022-12-05 NOTE — Progress Notes (Signed)
Office Visit Note   Patient: Denise Hendrix           Date of Birth: [birth year]           MRN: [id number] Visit Date: 12/04/2022 Requested by: Paschal Dopp, PA [address] [place],  [place] [address] PCP: Paschal Dopp, PA  Subjective: Chief Complaint  Patient presents with   Left Shoulder - Pain, Follow-up    HPI: Denise Hendrix is a 22 y.o. female who presents to the office reporting left shoulder pain.  She localizes pain to the anterior as well as posterior aspects of the left shoulder.  Describes a dull ache that is overall improved since her last visit after going through several visits of physical therapy.  She does note continued popping sensation.  She has been doing physical therapy on [employer].  Still notes some difficulty lifting where her shoulder will feel very fatigued if she keeps her arm overhead for long periods of time.  She has been using over-the-counter medications as well as ice and heat.  Denies any history of reinjury..                ROS: All systems reviewed are negative as they relate to the chief complaint within the history of present illness.  Patient denies fevers or chills.  Assessment & Plan: Visit Diagnoses:  1. Arthralgia of left acromioclavicular joint   2. Labral tear of shoulder, left, subsequent encounter     Plan: [name] is a 22 year old female who returns for evaluation of left shoulder pain.  She has history of left shoulder injury x 2 while lifting objects away from her body.  She had MRI months ago demonstrating small nondisplaced labral tear in the posterior inferior aspect of the labrum with possible labral tear at the superior aspect as well though these were very small focal tears.  No other significant pathology noted.  Based on her exam today, seems that most of her symptoms are related to [facility] joint arthralgia rather than the labral pathology though this is still contributing somewhat to her pain based on the  bicep related symptoms that she has as well.  We discussed options available to patient such as diagnostic/therapeutic cortisone injection versus topical Voltaren.  She will continue with over-the-counter medications and try topical Voltaren to apply to the [facility] joint region 3-4 times per day.  Follow-up in 4 weeks for clinical recheck and consideration of injection at that time if she has no relief from the topical medication.  Patient agreed with plan.  Follow-Up Instructions: No follow-ups on file.   Orders:  No orders of the defined types were placed in this encounter.  No orders of the defined types were placed in this encounter.     Procedures: No procedures performed   Clinical Data: No additional findings.  Objective: Vital Signs: There were no vitals taken for this visit.  Physical Exam:  Constitutional: Patient appears well-developed HEENT:  Head: Normocephalic Eyes:EOM are normal Neck: Normal range of motion Cardiovascular: Normal rate Pulmonary/chest: Effort normal Neurologic: Patient is alert Skin: Skin is warm Psychiatric: Patient has normal mood and affect  Ortho Exam: Ortho exam demonstrates left shoulder with well-preserved passive and active range of motion.  She has excellent rotator cuff strength of supra, infra, subscap rated 5/5.  She has mild tenderness over the bicipital groove.  Moderate tenderness over the [facility] joint that is asymmetric compared with the contralateral AC joint.  She has a little bit  of increased pain with crossarm adduction.  There is popping sensation noted in the [facility] joint region when she has passive motion of the shoulder.  There is no cellulitis or ecchymosis noted around the shoulder.  She does have a little bit of reproduction of pain with resisted bicep flexion and with resisted supination.  Specialty Comments:  No specialty comments available.  Imaging: No results found.   PMFS History: There are no problems to display for this  patient.  No past medical history on file.  No family history on file.  No past surgical history on file. Social History   Occupational History   Not on file  Tobacco Use   Smoking status: Every Day    Types: E-cigarettes   Smokeless tobacco: Not on file  Vaping Use   Vaping Use: Some days   Substances: Nicotine, Flavoring  Substance and Sexual Activity   Alcohol use: Never   Drug use: Never   Sexual activity: Not on file

## 2023-01-01 ENCOUNTER — Ambulatory Visit: Payer: Medicaid Other | Admitting: Surgical

## 2023-01-15 ENCOUNTER — Ambulatory Visit: Payer: Medicaid Other | Admitting: Surgical

## 2023-02-02 ENCOUNTER — Ambulatory Visit (INDEPENDENT_AMBULATORY_CARE_PROVIDER_SITE_OTHER): Payer: Medicaid Other | Admitting: Surgical

## 2023-02-02 DIAGNOSIS — M25512 Pain in left shoulder: Secondary | ICD-10-CM

## 2023-02-03 ENCOUNTER — Encounter: Payer: Self-pay | Admitting: Surgical

## 2023-02-03 NOTE — Progress Notes (Signed)
Follow-up Office Visit Note   Patient: Denise Hendrix           Date of Birth: [birth year]           MRN: [id number] Visit Date: 02/02/2023 Requested by: Paschal Dopp, PA [address] [place],  [place] [address] PCP: Paschal Dopp, PA  Subjective: Chief Complaint  Patient presents with   Left Shoulder - Pain, Follow-up    HPI: Denise Hendrix is a 22 y.o. female who returns to the office for follow-up visit.    Plan at last visit was: Denise Hendrix is a 22 year old female who returns for evaluation of left shoulder pain. She has history of left shoulder injury x 2 while lifting objects away from her body. She had MRI months ago demonstrating small nondisplaced labral tear in the posterior inferior aspect of the labrum with possible labral tear at the superior aspect as well though these were very small focal tears. No other significant pathology noted. Based on her exam today, seems that most of her symptoms are related to [facility] joint arthralgia rather than the labral pathology though this is still contributing somewhat to her pain based on the bicep related symptoms that she has as well. We discussed options available to patient such as diagnostic/therapeutic cortisone injection versus topical Voltaren. She will continue with over-the-counter medications and try topical Voltaren to apply to the [facility] joint region 3-4 times per day. Follow-up in 4 weeks for clinical recheck and consideration of injection at that time if she has no relief from the topical medication. Patient agreed with plan.   Since then, patient notes her shoulder is overall doing a little better.  Still gets small amount of pain when she overworks it based on her activity level.  She has gradually been able to improve how much she is lifting from about 15 pounds to 25 pounds.  Still working at [employer].  Having new left arm pain from a recent Nexplanon implant in the left arm earlier this week.              ROS:  All systems reviewed are negative as they relate to the chief complaint within the history of present illness.  Patient denies fevers or chills.  Assessment & Plan: Visit Diagnoses:  1. Arthralgia of left acromioclavicular joint     Plan: Denise Hendrix is a 22 y.o. female who returns to the office for follow-up visit for left shoulder pain.  Plan from last visit was noted above in HPI.  They now return with improvement of left shoulder pain.  She has not had full resolution of symptoms at this point but she is overall satisfied with how her shoulder is feeling.  She still has what appears to be more AC joint related symptoms but with steady improvement of her pain, she would like to continue what she is doing and to avoid injection.  Think that if her symptoms do not fully resolve by late October, she can return and try diagnostic/therapeutic [facility] joint injection.  If her symptoms worsen, she may return sooner.  Follow-up as needed if symptoms do not resolve.  Follow-Up Instructions: No follow-ups on file.   Orders:  No orders of the defined types were placed in this encounter.  No orders of the defined types were placed in this encounter.     Procedures: No procedures performed   Clinical Data: No additional findings.  Objective: Vital Signs: There were no vitals taken for this visit.  Physical  Exam:  Constitutional: Patient appears well-developed HEENT:  Head: Normocephalic Eyes:EOM are normal Neck: Normal range of motion Cardiovascular: Normal rate Pulmonary/chest: Effort normal Neurologic: Patient is alert Skin: Skin is warm Psychiatric: Patient has normal mood and affect  Ortho Exam: Ortho exam demonstrates left shoulder with full active and passive range of motion.  She has minimal tenderness over the bicipital groove.  She has mild to moderate tenderness over the left [facility] joint that is not present on the right AC joint.  No pain with crossarm adduction.  She has  excellent strength of supra, infra, subscap without reproduction of pain and without weakness.  Axillary nerve is intact with deltoid firing.  2+ radial pulse of the left upper extremity.  Intact EPL, FPL, finger abduction, pronation/supination, grip strength, bicep, tricep, deltoid rated 5/5.  Specialty Comments:  No specialty comments available.  Imaging: No results found.   PMFS History: There are no problems to display for this patient.  No past medical history on file.  No family history on file.  No past surgical history on file. Social History   Occupational History   Not on file  Tobacco Use   Smoking status: Every Day    Types: E-cigarettes   Smokeless tobacco: Not on file  Vaping Use   Vaping status: Some Days   Substances: Nicotine, Flavoring  Substance and Sexual Activity   Alcohol use: Never   Drug use: Never   Sexual activity: Not on file

## 2023-05-16 ENCOUNTER — Ambulatory Visit (INDEPENDENT_AMBULATORY_CARE_PROVIDER_SITE_OTHER): Payer: Medicaid Other | Admitting: Surgical

## 2023-05-16 ENCOUNTER — Encounter: Payer: Self-pay | Admitting: Surgical

## 2023-05-16 DIAGNOSIS — M25512 Pain in left shoulder: Secondary | ICD-10-CM | POA: Diagnosis not present

## 2023-05-20 ENCOUNTER — Encounter: Payer: Self-pay | Admitting: Surgical

## 2023-05-20 NOTE — Progress Notes (Signed)
Follow-up Office Visit Note   Patient: Denise Hendrix           Date of Birth: [birth year]           MRN: [id number] Visit Date: 05/16/2023 Requested by: Paschal Dopp, PA [address] [place],  [place] [address] PCP: Paschal Dopp, PA  Subjective: Chief Complaint  Patient presents with   Left Shoulder - Pain    HPI: Denise Hendrix is a 22 y.o. female who returns to the office for follow-up visit.    Plan at last visit was: Denise Hendrix is a 22 y.o. female who returns to the office for follow-up visit for left shoulder pain.  Plan from last visit was noted above in HPI.  They now return with improvement of left shoulder pain.  She has not had full resolution of symptoms at this point but she is overall satisfied with how her shoulder is feeling.  She still has what appears to be more AC joint related symptoms but with steady improvement of her pain, she would like to continue what she is doing and to avoid injection.  Think that if her symptoms do not fully resolve by late October, she can return and try diagnostic/therapeutic [facility] joint injection.  If her symptoms worsen, she may return sooner.  Follow-up as needed if symptoms do not resolve.    Since then, patient notes she feels she is doing better in regards to her pain.  She continues to work at [employer].  She is using Voltaren gel at times.  She states that lifting will "tire her shoulder out".  Previously she was having difficulty with lifting 2025 pounds but now she is able to lift this weight without much difficulty and has more fatigue with lifting 30 to 35 pounds overhead.  No new injury.  Pain does not wake her up at night.              ROS: All systems reviewed are negative as they relate to the chief complaint within the history of present illness.  Patient denies fevers or chills.  Assessment & Plan: Visit Diagnoses:  1. Arthralgia of left acromioclavicular joint     Plan: Denise Hendrix is a  22 y.o. female who returns to the office for follow-up visit.  Plan from last visit was noted above in HPI.  They now return with improvement in symptoms over the last 3 months but not full resolution.  We discussed options and patient wants to hold off on any [facility] joint injection.  She will continue using Voltaren gel and see if this pain will continue with current course of improvement over the next several months.  Recommend she follow-up with the office as needed if she would like to consider injection or if pain gets worse but if it stays on its current course, hopefully will eventually resolve altogether.  Follow-Up Instructions: No follow-ups on file.   Orders:  No orders of the defined types were placed in this encounter.  No orders of the defined types were placed in this encounter.     Procedures: No procedures performed   Clinical Data: No additional findings.  Objective: Vital Signs: There were no vitals taken for this visit.  Physical Exam:  Constitutional: Patient appears well-developed HEENT:  Head: Normocephalic Eyes:EOM are normal Neck: Normal range of motion Cardiovascular: Normal rate Pulmonary/chest: Effort normal Neurologic: Patient is alert Skin: Skin is warm Psychiatric: Patient has normal mood and affect  Ortho Exam: Ortho  exam demonstrates left shoulder with 70 degrees X rotation, 130 degrees abduction, 180 degrees forward elevation passively and actively.  She has excellent rotator cuff strength of supra, infra, subscap.  Axillary nerve intact with deltoid firing.  She has mild to moderate tenderness over the left [facility] joint with no tenderness over the right AC joint.  No crepitus noted with passive motion of the shoulder.  Negative crossarm adduction test.  Specialty Comments:  No specialty comments available.  Imaging: No results found.   PMFS History: There are no problems to display for this patient.  History reviewed. No pertinent past medical  history.  No family history on file.  History reviewed. No pertinent surgical history. Social History   Occupational History   Not on file  Tobacco Use   Smoking status: Every Day    Types: E-cigarettes   Smokeless tobacco: Not on file  Vaping Use   Vaping status: Some Days   Substances: Nicotine, Flavoring  Substance and Sexual Activity   Alcohol use: Never   Drug use: Never   Sexual activity: Not on file

## 2023-11-30 ENCOUNTER — Encounter: Payer: Self-pay | Admitting: Surgical

## 2023-11-30 ENCOUNTER — Ambulatory Visit: Admitting: Surgical

## 2023-11-30 ENCOUNTER — Other Ambulatory Visit (INDEPENDENT_AMBULATORY_CARE_PROVIDER_SITE_OTHER): Payer: Self-pay

## 2023-11-30 DIAGNOSIS — M25512 Pain in left shoulder: Secondary | ICD-10-CM

## 2023-11-30 NOTE — Progress Notes (Signed)
 Office Visit Note   Patient: Denise Hendrix           Date of Birth: 2001/03/06           MRN: [id number] Visit Date: 11/30/2023 Requested by: Murray Arnold, PA [address] [place],  [place] [address] PCP: Murray Arnold, PA  Subjective: Chief Complaint  Patient presents with   Left Shoulder - Pain    DOI 11/19/2023    HPI: Denise Hendrix is a 23 y.o. female who presents to the office reporting left shoulder pain.  Patient has history of left shoulder pain in the past with MRI demonstrating nondisplaced posterior labral tear.  She has also had AC joint related symptoms.  These were doing well overall after former treatment.  However, she recently was in [place] on 11/19/2023 and while she was reaching for something on shelves, some improperly stocked bottles of [employer]eps[employer] fell off of the shelf and hit her on the posterior aspect of her shoulder along the upper scapular border.  She has had increased pain and discomfort in this region since the event.  She has had increased difficulty laying on that left side due to pain.  Pain does not wake her up at night.  She has no radiating or radicular pain.  She has occasional clicking sensation around the [facility] joint.  She has increased fatigue of the left arm and reports that she can do a lot of her activities of daily living but she has to take breaks.  For example she only has the endurance to do about 1 load of laundry at a time as opposed to the 3-4 loads that she typically does.  Fortunately for her, symptoms are about 65% better compared with the day of injury.  She works at [employer].  This involves a fair amount of lifting overhead at times.  Has been taking meloxicam with no substantial relief..                ROS: All systems reviewed are negative as they relate to the chief complaint within the history of present illness.  Patient denies fevers or chills.  Assessment & Plan: Visit Diagnoses:  1. Acute pain of left shoulder      Plan: Impression is 23 year old female who sustained injury from several sixpacks of bottles of [employer]eps[employer] falling off of a store shelf and hitting her on the shoulder blade region.  She has about 65% symptomatic improvement since the date of injury about 10 days ago.  Exam is reassuring except for one finding which is new external rotation weakness compared with the contralateral side.  There is no coarseness or crepitus concerning for rotator cuff injury but with her weakness and reproduction of pain, would recommend continuing with light duty for 2 weeks and then seeing if she can return to full duty for another 2 weeks after that.  See her back in 4 weeks for clinical recheck particularly to look for any ongoing external rotation weakness.  If she has inability to return to full duty or continuing weakness, next step would be MRI arthrogram of the left shoulder for further evaluation of rotator cuff tear.  Patient agreed with plan.  Follow-up in 1 month.  Follow-Up Instructions: No follow-ups on file.   Orders:  Orders Placed This Encounter  Procedures   XR Shoulder Left   No orders of the defined types were placed in this encounter.     Procedures: No procedures performed   Clinical Data:  No additional findings.  Objective: Vital Signs: There were no vitals taken for this visit.  Physical Exam:  Constitutional: Patient appears well-developed HEENT:  Head: Normocephalic Eyes:EOM are normal Neck: Normal range of motion Cardiovascular: Normal rate Pulmonary/chest: Effort normal Neurologic: Patient is alert Skin: Skin is warm Psychiatric: Patient has normal mood and affect  Ortho Exam: Ortho exam demonstrates left shoulder with 60 degrees X rotation, 100 degrees abduction, 180 degrees forward elevation passively and actively.  She has no restriction to active motion.  She has negative external rotation lag sign.  Negative Hornblower sign.  She has mild tenderness over the [facility]  joint.  No ecchymosis noted.  She has some tenderness through the scapular region primarily around the muscle belly of the supraspinatus.  There is no coarse grinding or crepitus noted with passive motion of the shoulder.  Intact EPL, FPL, finger abduction, pronation/supination, bicep, tricep, deltoid without weakness.  She has excellent subscapularis strength rated 5/5.  She does have difference in infraspinatus strength relative to the contralateral side with 5/5 strength in the right shoulder and 4/5 strength in the left shoulder.  This does reproduce some scapular pain.  Specialty Comments:  No specialty comments available.  Imaging: XR Shoulder Left Result Date: 11/30/2023 AP, Scap Y, axillary views of left shoulder reviewed.  No fracture or dislocation.  No abnormality noted to the glenohumeral or acromioclavicular joints.  No abnormal acromiohumeral interval    PMFS History: There are no active problems to display for this patient.  History reviewed. No pertinent past medical history.  History reviewed. No pertinent family history.  History reviewed. No pertinent surgical history. Social History   Occupational History   Not on file  Tobacco Use   Smoking status: Every Day    Types: E-cigarettes   Smokeless tobacco: Not on file  Vaping Use   Vaping status: Some Days   Substances: Nicotine, Flavoring  Substance and Sexual Activity   Alcohol use: Never   Drug use: Never   Sexual activity: Not on file

## 2023-12-02 ENCOUNTER — Encounter: Payer: Self-pay | Admitting: Surgical

## 2023-12-19 IMAGING — US US PELVIS COMPLETE
1 series · 13 of 25 positions shown · non-contrast
Comparison: None Available.

CLINICAL DATA: Vaginal bleeding and pelvic pain

EXAM:
TRANSABDOMINAL AND TRANSVAGINAL ULTRASOUND OF PELVIS
DOPPLER ULTRASOUND OF OVARIES
TECHNIQUE: Both transabdominal and transvaginal ultrasound examinations of the
pelvis were performed. Transabdominal technique was performed for
global imaging of the pelvis including uterus, ovaries, adnexal
regions, and pelvic cul-de-sac.
It was necessary to proceed with endovaginal exam following the
transabdominal exam to visualize the uterus and ovaries. Color and
duplex Doppler ultrasound was utilized to evaluate blood flow to the
ovaries.

[Series 1: us pelvis (transabdominal only) · 13 of 69 slices shown]
[im 1/69]
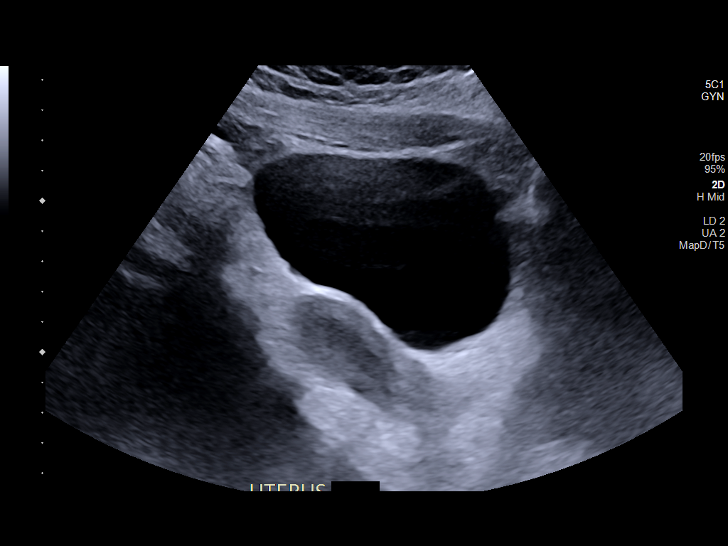
[im 6/69]
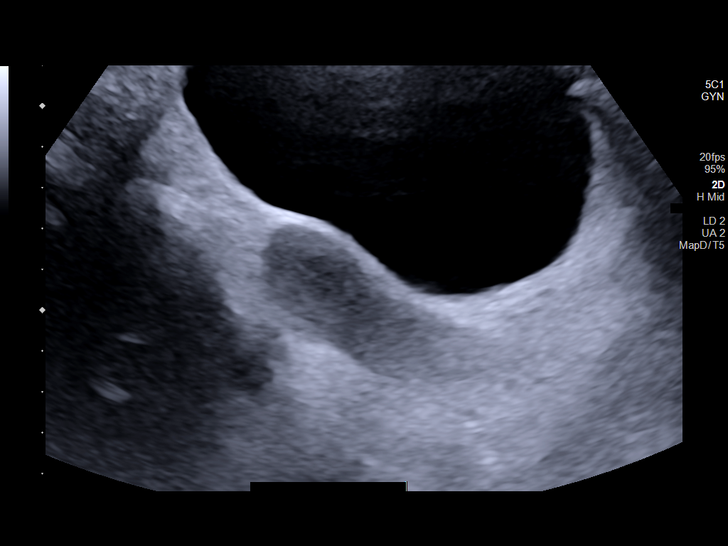
[im 12/69]
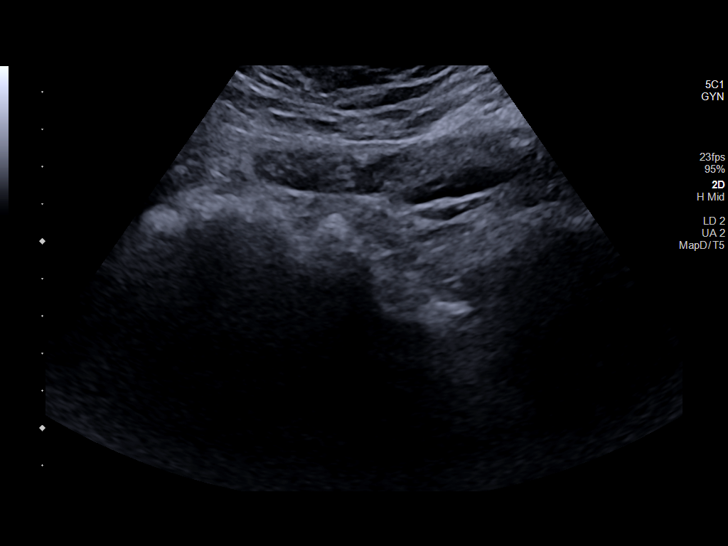
[im 18/69]
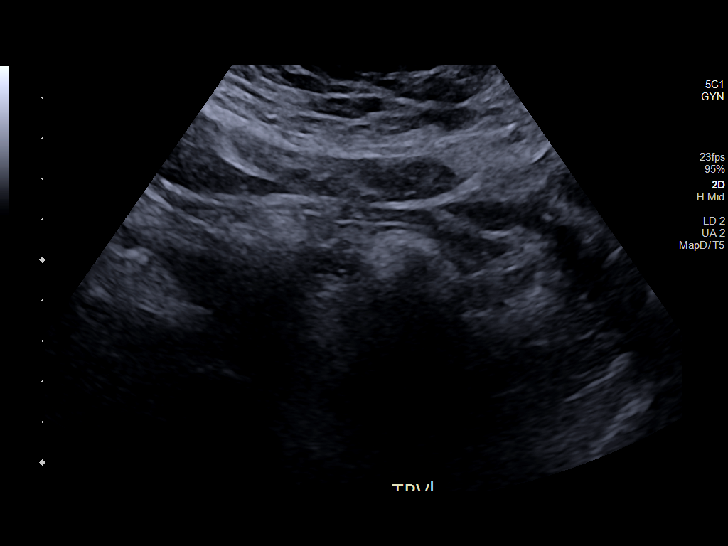
[im 23/69]
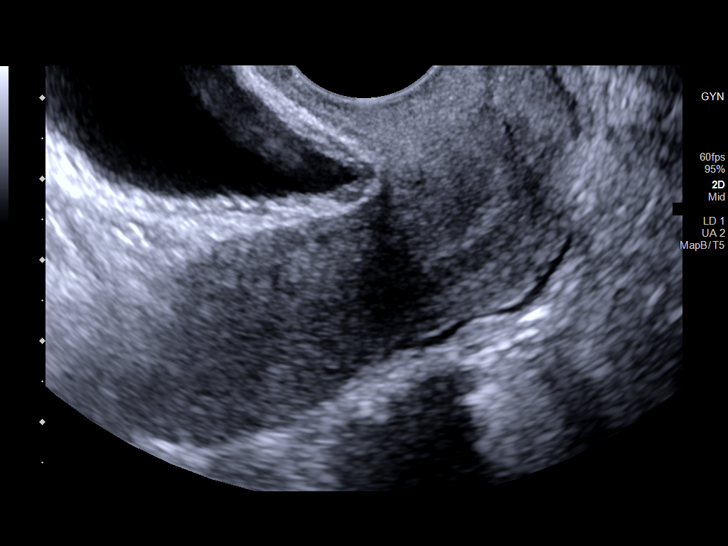
[im 29/69]
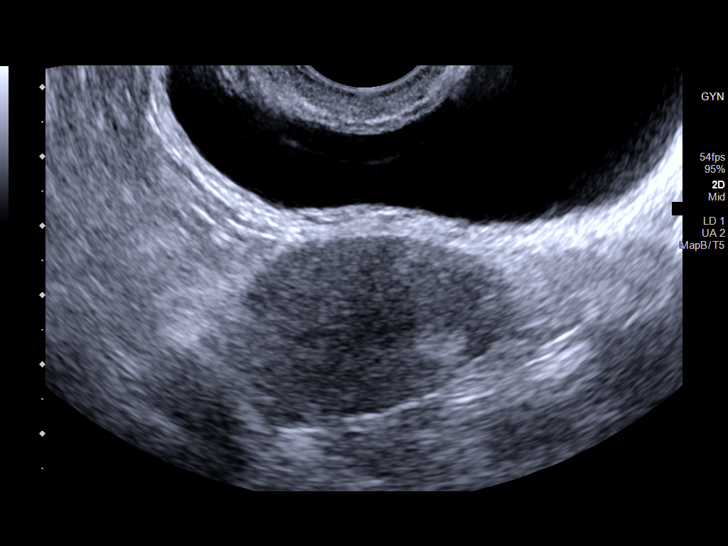
[im 35/69]
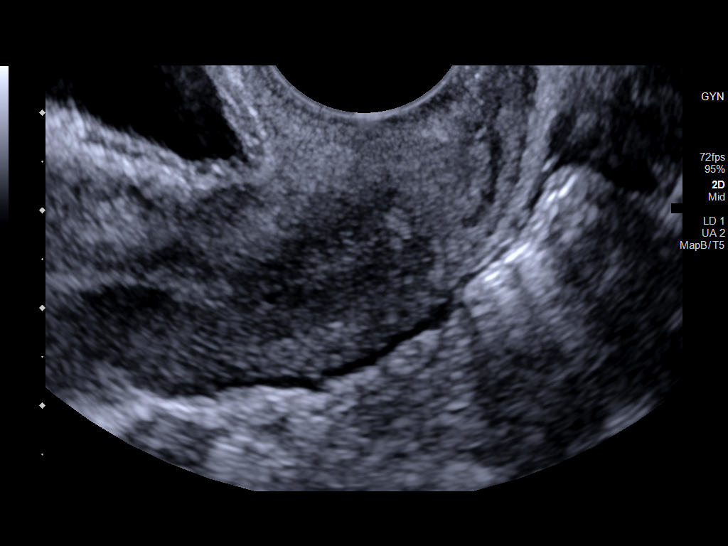
[im 40/69]
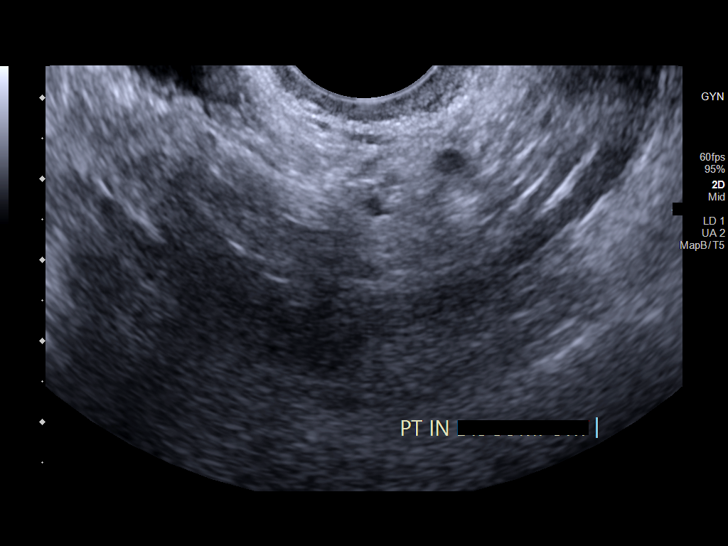
[im 46/69]
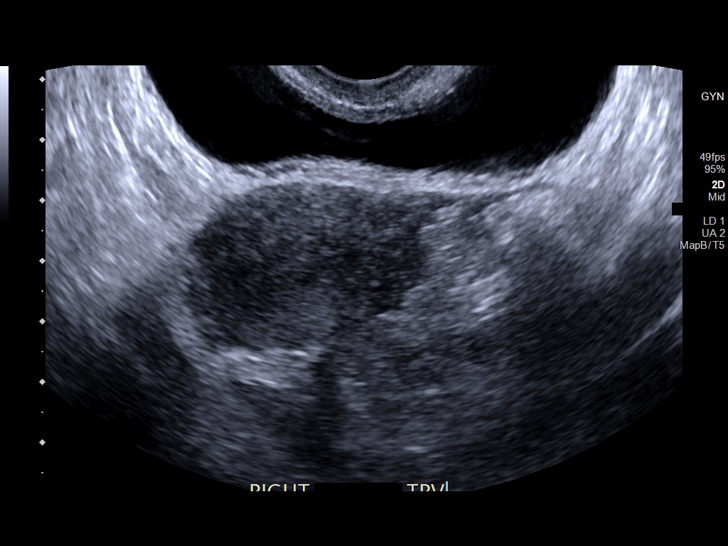
[im 52/69]
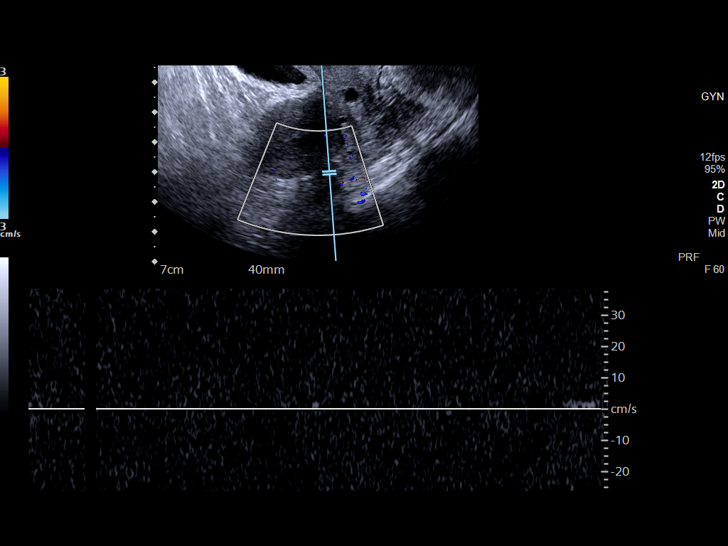
[im 57/69]
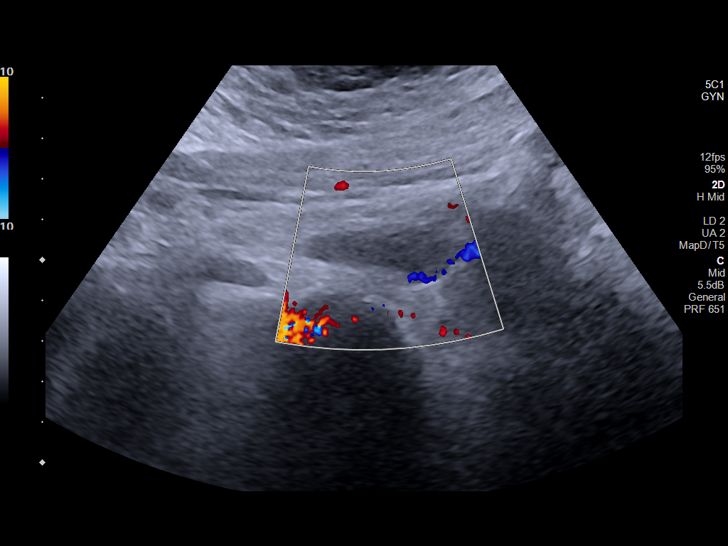
[im 63/69]
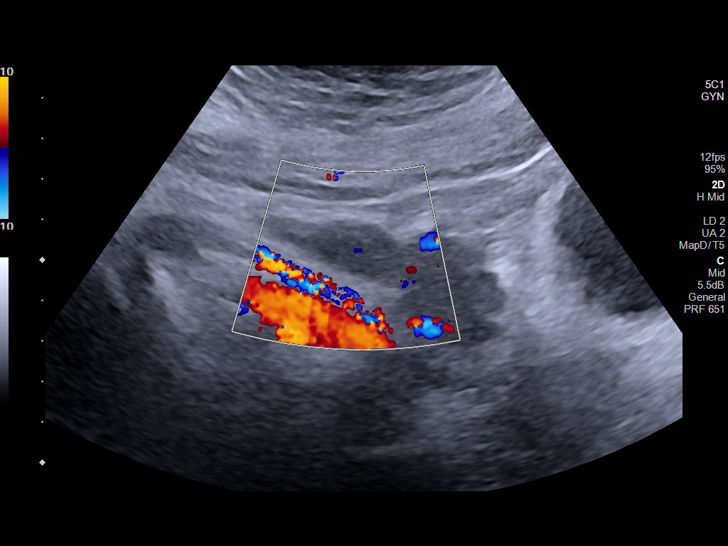
[im 69/69]
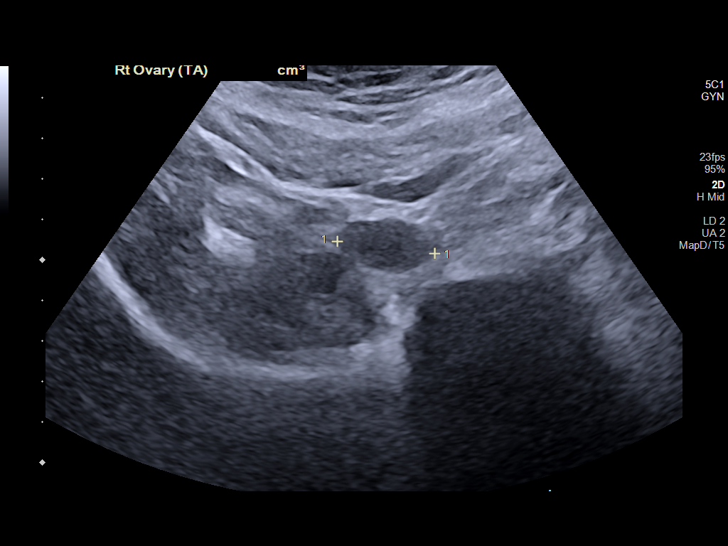

[13 of 25 positions shown; findings below may reference images not displayed]

FINDINGS: Uterus

Measurements: 6.0 x 2.5 x 3.6 cm = volume: 29 mL. No fibroids or
other mass visualized. Nabothian cyst noted.

Endometrium

Thickness: 3.3 mm.  No focal abnormality visualized.

Right ovary

Measurements: 1.7 x 1.6 x 1.5 cm = volume: 2.0 mL. Normal
appearance/no adnexal mass.

Left ovary

Measurements: 3.9 x 1.7 x 1.6 cm = volume: 5.4 mL. Normal
appearance/no adnexal mass.

Pulsed Doppler evaluation of both ovaries demonstrates normal
low-resistance arterial and venous waveforms.

Other findings

No abnormal free fluid.
IMPRESSION: Normal pelvic ultrasound.

## 2023-12-28 ENCOUNTER — Ambulatory Visit: Admitting: Surgical

## 2023-12-31 ENCOUNTER — Ambulatory Visit (INDEPENDENT_AMBULATORY_CARE_PROVIDER_SITE_OTHER): Admitting: Surgical

## 2023-12-31 DIAGNOSIS — M25512 Pain in left shoulder: Secondary | ICD-10-CM | POA: Diagnosis not present

## 2024-01-06 ENCOUNTER — Encounter: Payer: Self-pay | Admitting: Surgical

## 2024-01-06 NOTE — Progress Notes (Signed)
 Follow-up Office Visit Note   Patient: Denise Hendrix           Date of Birth: June 17, 2001           MRN: [id number] Visit Date: 12/31/2023 Requested by: Dow Ozell PARAS, PA [address] [place],  [place] [address] PCP: Dow Ozell PARAS, PA  Subjective: Chief Complaint  Patient presents with   Left Shoulder - Pain    HPI: Denise Hendrix is a 23 y.o. female who returns to the office for follow-up visit.    Plan at last visit was: Impression is 23 year old female who sustained injury from several sixpacks of bottles of [employer] falling off of a store shelf and hitting her on the shoulder blade region. She has about 65% symptomatic improvement since the date of injury about 10 days ago. Exam is reassuring except for one finding which is new external rotation weakness compared with the contralateral side. There is no coarseness or crepitus concerning for rotator cuff injury but with her weakness and reproduction of pain, would recommend continuing with light duty for 2 weeks and then seeing if she can return to full duty for another 2 weeks after that. See her back in 4 weeks for clinical recheck particularly to look for any ongoing external rotation weakness. If she has inability to return to full duty or continuing weakness, next step would be MRI arthrogram of the left shoulder for further evaluation of rotator cuff tear. Patient agreed with plan. Follow-up in 1 month.   Since then, patient notes pain will come and go and overall is improved compared with last visit but still has continued soreness especially with overhead motion.  Taking ibuprofen  and Tylenol .  She can reach overhead and she can lift up to 35 pounds at work but this causes increased stress on her shoulder and makes her pain worse.  She is able to lay on that side for short periods of time.  Does not really notice any weakness, more pain symptoms.  The pain is actually more in the lateral aspect of the shoulder now  more so than it was in the scapular region.  The scapular pain has overall resolved.              ROS: All systems reviewed are negative as they relate to the chief complaint within the history of present illness.  Patient denies fevers or chills.  Assessment & Plan: Visit Diagnoses:  1. Acute pain of left shoulder     Plan: Denise Hendrix is a 23 y.o. female who returns to the office for follow-up visit.  Plan from last visit was noted above in HPI.  They now return with slightly improved external rotation strength of the left shoulder relative to the last visit.  She is having some mechanical symptoms and reproducible popping in the posterior aspect of the shoulder with range of motion.  She does have history of nondisplaced labral tear which could be worse or just more symptomatic and causing symptoms.  Also could be related to her [facility] joint which has bothered her in the past.  We discussed options such as diagnostic AC joint injection versus further MRI imaging versus waiting this out because it does seem to be improving overall.  She would like to wait another 4 weeks and then we can reevaluate.  We did perform ultrasound evaluation of the posterior aspect of the shoulder demonstrating no significant glenohumeral joint effusion in the posterior recess and no evidence of spinoglenoid notch  cyst.  Follow-up in 4 weeks.  Follow-Up Instructions: Return in about 4 weeks (around 01/28/2024).   Orders:  No orders of the defined types were placed in this encounter.  No orders of the defined types were placed in this encounter.     Procedures: No procedures performed   Clinical Data: No additional findings.  Objective: Vital Signs: There were no vitals taken for this visit.  Physical Exam:  Constitutional: Patient appears well-developed HEENT:  Head: Normocephalic Eyes:EOM are normal Neck: Normal range of motion Cardiovascular: Normal rate Pulmonary/chest: Effort normal Neurologic:  Patient is alert Skin: Skin is warm Psychiatric: Patient has normal mood and affect  Ortho Exam: Ortho exam demonstrates left shoulder with similar range of motion compared with last visit.  No restriction to active motion.  She has improved external rotation strength of the left shoulder relative to the last visit with 5 -/5 infraspinatus strength but still slightly decreased compared with the right shoulder infraspinatus strength.  Supra and subscap strength rated 5/5.  No reproduction of scapular pain with rotator cuff strength testing.  She does have some tenderness over the [facility] joint and pain reproduced with crossarm adduction and resisted abduction of the left shoulder.  No tenderness or deformity or ecchymosis noted through the scapular region.  Specialty Comments:  No specialty comments available.  Imaging: No results found.   PMFS History: There are no active problems to display for this patient.  History reviewed. No pertinent past medical history.  History reviewed. No pertinent family history.  History reviewed. No pertinent surgical history. Social History   Occupational History   Not on file  Tobacco Use   Smoking status: Every Day    Types: E-cigarettes   Smokeless tobacco: Not on file  Vaping Use   Vaping status: Some Days   Substances: Nicotine, Flavoring  Substance and Sexual Activity   Alcohol use: Never   Drug use: Never   Sexual activity: Not on file

## 2024-01-17 ENCOUNTER — Ambulatory Visit: Admitting: Surgical

## 2024-01-31 ENCOUNTER — Telehealth: Payer: Self-pay | Admitting: Radiology

## 2024-01-31 ENCOUNTER — Ambulatory Visit: Admitting: Surgical

## 2024-01-31 DIAGNOSIS — S43432D Superior glenoid labrum lesion of left shoulder, subsequent encounter: Secondary | ICD-10-CM | POA: Diagnosis not present

## 2024-01-31 DIAGNOSIS — M25512 Pain in left shoulder: Secondary | ICD-10-CM | POA: Diagnosis not present

## 2024-01-31 NOTE — Telephone Encounter (Signed)
 Patient came in to the office today and states that her father needs a copy of her medical records from this injury. Injury date 11/19/2023.  I advised Luke's note from appointment today would not be ready yet. She asked if we could please print off all notes once today's is dictated and place at front desk for her to pick up.  Please call when ready.

## 2024-02-01 NOTE — Telephone Encounter (Signed)
 Waiting for 7/17 ov note

## 2024-02-08 NOTE — Telephone Encounter (Signed)
 IC patient. Lmvm for patient to [facility]. Notes are ready at front dest with authorization to sign.

## 2024-02-08 NOTE — Progress Notes (Signed)
 Follow-up Office Visit Note   Patient: Denise Hendrix           Date of Birth: 03-Nov-2000           MRN: [id number] Visit Date: 01/31/2024 Requested by: Dow Ozell PARAS, PA [address] [place],  [place] [address] PCP: Dow Ozell PARAS, PA  Subjective: Chief Complaint  Patient presents with   Left Shoulder - Pain, Follow-up    [place] 11/19/2023    HPI: Denise Hendrix is a 23 y.o. female who returns to the office for follow-up visit.    Plan at last visit was: Denise Hendrix is a 23 y.o. female who returns to the office for follow-up visit.  Plan from last visit was noted above in HPI.  They now return with slightly improved external rotation strength of the left shoulder relative to the last visit.  She is having some mechanical symptoms and reproducible popping in the posterior aspect of the shoulder with range of motion.  She does have history of nondisplaced labral tear which could be worse or just more symptomatic and causing symptoms.  Also could be related to her [facility] joint which has bothered her in the past.  We discussed options such as diagnostic AC joint injection versus further MRI imaging versus waiting this out because it does seem to be improving overall.  She would like to wait another 4 weeks and then we can reevaluate.  We did perform ultrasound evaluation of the posterior aspect of the shoulder demonstrating no significant glenohumeral joint effusion in the posterior recess and no evidence of spinoglenoid notch cyst.  Follow-up in 4 weeks.   Since then, patient notes she was doing a little bit better but in the last few weeks her pain has actually gone downhill compared with last visit.  She continues her work at [employer] and has been trying to avoid heavy lifting.  At this point, even extended periods of holding a tablet will cause her shoulder pain to come on and her arm to give out.  She begins her shift without much shoulder pain and no disability until  the extended use causes increased pain.  She does note an increase in the popping sensation that she was experiencing at the last visit.  She is able to lay on her left side at times but not consistently.  In terms of what bothers her more, weakness is a bigger problem for her rather than pain but both bother her.  She takes occasional Tylenol .              ROS: All systems reviewed are negative as they relate to the chief complaint within the history of present illness.  Patient denies fevers or chills.  Assessment & Plan: Visit Diagnoses:  1. Labral tear of shoulder, left, subsequent encounter   2. Acute pain of left shoulder     Plan: Denise Hendrix is a 23 y.o. female who returns to the office for follow-up visit.  Plan from last visit was noted above in HPI.  They now return with continued left shoulder discomfort and weakness subjectively.  We discussed options available to patient such as AC joint injection versus MRI scan versus physical therapy formally.  She has had good success with PT in the past and with her anxiety about needles, she would like to avoid injection/MRI unless absolutely necessary.  We can try physical therapy to focus on rotator cuff strengthening exercises for potentially symptomatic labral pathology over the next 4 to  6 weeks.  Follow-up in 6 weeks for clinical recheck and if no improvement at that time, [name] said that she would like to consider glenohumeral versus AC joint injection.  She does not have any symptoms of instability or apprehension noted on exam today.  Follow-Up Instructions: No follow-ups on file.   Orders:  Orders Placed This Encounter  Procedures   Ambulatory referral to Physical Therapy   No orders of the defined types were placed in this encounter.     Procedures: No procedures performed   Clinical Data: No additional findings.  Objective: Vital Signs: There were no vitals taken for this visit.  Physical Exam:  Constitutional:  Patient appears well-developed HEENT:  Head: Normocephalic Eyes:EOM are normal Neck: Normal range of motion Cardiovascular: Normal rate Pulmonary/chest: Effort normal Neurologic: Patient is alert Skin: Skin is warm Psychiatric: Patient has normal mood and affect  Ortho Exam: Ortho exam demonstrates left shoulder with 70 degrees X rotation, 120 degrees abduction, 170 degrees forward elevation passively and actively.  Axillary nerve intact with deltoid firing.  She has mild tenderness over the bicipital groove.  Moderate tenderness over the [facility] joint of the left shoulder.  Clicking and crepitus that was noted on exam at previous appointment is still present primarily in the posterior aspect of the shoulder.  This is reproduced with axial load of the shoulder and internal/external rotation.  She has intact rotator cuff strength with improved external rotation strength compared with previous exams.  Rotator cuff strength of supra, infra, subscap rated 5/5 of the left shoulder.  No deformity noted to the shoulder.  She has intact EPL, FPL, finger abduction, pronation/supination, bicep, tricep, deltoid.  Negative apprehension test.  Specialty Comments:  No specialty comments available.  Imaging: No results found.   PMFS History: There are no active problems to display for this patient.  No past medical history on file.  No family history on file.  No past surgical history on file. Social History   Occupational History   Not on file  Tobacco Use   Smoking status: Every Day    Types: E-cigarettes   Smokeless tobacco: Not on file  Vaping Use   Vaping status: Some Days   Substances: Nicotine, Flavoring  Substance and Sexual Activity   Alcohol use: Never   Drug use: Never   Sexual activity: Not on file

## 2024-02-12 ENCOUNTER — Telehealth: Payer: Self-pay | Admitting: Surgical

## 2024-02-12 NOTE — Telephone Encounter (Signed)
 Received msg from pt returning my call. IC,lmvm advised copy of records are ready at front desk at [facility] in [place] with an authorization attached for signature.[phone]

## 2024-02-13 ENCOUNTER — Other Ambulatory Visit: Payer: Self-pay

## 2024-02-13 ENCOUNTER — Ambulatory Visit: Attending: Surgical

## 2024-02-13 DIAGNOSIS — M25512 Pain in left shoulder: Secondary | ICD-10-CM | POA: Insufficient documentation

## 2024-02-13 DIAGNOSIS — M6281 Muscle weakness (generalized): Secondary | ICD-10-CM | POA: Insufficient documentation

## 2024-02-13 NOTE — Therapy (Signed)
 OUTPATIENT PHYSICAL THERAPY SHOULDER EVALUATION   Patient Name: Denise Hendrix MRN: [id number] DOB:2000/12/16, 23 y.o., female Today's Date: 02/13/2024  END OF SESSION:  Visit Number 1 Number of Visits 12 Date for PT re-eval 03/27/2024 Authorization Type [facility] [employer] PT start time 1533 PT stop time 1616 PT time calculation (min) 43 min   Equipment Utilized during treatment Activity Tolerance Behavior During Therapy   No past medical history on file. No past surgical history on file. There are no active problems to display for this patient.   PCP: Dow Ozell PARAS, PA  REFERRING PROVIDER:  Shirly Carlin CROME, PA-C   REFERRING DIAG:  [id number] (ICD-10-CM) - Acute pain of left shoulder  Left shoulder PT 1-2x/week x 6 weeks Focus on strengthening exercises, symptomatic labral pathology  THERAPY DIAG:  Left shoulder pain, unspecified chronicity  Muscle weakness (generalized)  Rationale for Evaluation and Treatment: Rehabilitation  ONSET DATE: 11/19/2023   SUBJECTIVE:                                                                                                                                                                                      SUBJECTIVE STATEMENT: Patient reports to PT with L shoulder pain that has been present since something fell onto the back of L shoulder. She has trouble holding tablet for extended periods of time while working at [employer]. She states that her arm will give out on her, and that weakness is a bigger problem for her than pain. She would like to be able to fulfill all her work requirements including lifting 35lbs. Still lifting 3 gallons of lemonade; not lifting boxes of ice cream 45-48lb.   Hand dominance: Right   PERTINENT HISTORY: No relevant PMHx   PAIN:  Are you having pain? Yes: NPRS scale: 6/10 current, 8/10 worst Pain location: posterior L shoulder Pain description: heavy, tense, pinching (with lifting)   Aggravating factors: lifting, OH reaching lifting, sporadic movements  Relieving factors: Voltaren gel, aleve , tylenol     PRECAUTIONS: None  RED FLAGS: None   WEIGHT BEARING RESTRICTIONS: No  FALLS:  Has patient fallen in last 6 months? No  LIVING ENVIRONMENT: Lives with: lives with their family  OCCUPATION: Team Member/Front of Technical brewer at [employer]   PLOF: Independent  PATIENT GOALS: Patient would like to have the clicking and popping decreasing; and be able to lift at least 35lb  NEXT MD VISIT:   OBJECTIVE:  Note: Objective measures were completed at Evaluation unless otherwise noted.  DIAGNOSTIC FINDINGS:  11/30/2023 L shoulder XRAY  AP, Scap Y, axillary views of left shoulder reviewed.  No fracture or  dislocation.  No abnormality noted to the  glenohumeral or  acromioclavicular joints.  No abnormal acromiohumeral interval   PATIENT SURVEYS:  Quick Dash:  QUICK DASH  Please rate your ability do the following activities in the last week by selecting the number below the appropriate response.   Activities Rating  Open a tight or new jar.  3 = Moderate difficulty  Do heavy household chores (e.g., wash walls, floors). 3 = Moderate difficulty  Carry a shopping bag or briefcase 2 = Mild difficulty  Wash your back. 1 = No difficulty   Use a knife to cut food. 1 = No difficulty   Recreational activities in which you take some force or impact through your arm, shoulder or hand (e.g., golf, hammering, tennis, etc.). 3 = Moderate difficulty  During the past week, to what extent has your arm, shoulder or hand problem interfered with your normal social activities with family, friends, neighbors or groups?  3 = Moderately  During the past week, were you limited in your work or other regular daily activities as a result of your arm, shoulder or hand problem? 3 = Moderately limited  Rate the severity of the following symptoms in the last week: Arm, Shoulder, or hand  pain. 3 = Moderate  Rate the severity of the following symptoms in the last week: Tingling (pins and needles) in your arm, shoulder or hand. 1 = none  During the past week, how much difficulty have you had sleeping because of the pain in your arm, shoulder or hand?  2 = Mild difficulty   (A QuickDASH score may not be calculated if there is greater than 1 missing item.)  Quick Dash Disability/Symptom Score: 24  Minimally Clinically Important Difference (MCID): 15-20 points  Flavio, F. et al. (2013). Minimally clinically important difference of the disabilities of the arm, shoulder, and hand outcome measures (DASH) and its shortened version (Quick DASH). Journal of Orthopaedic & Sports Physical Therapy, 44(1), 30-39)   COGNITION: Overall cognitive status: Within functional limits for tasks assessed     SENSATION: Not tested  POSTURE: Mild shoulder rounding, mild forward  UPPER EXTREMITY ROM: WFL BIL, pain with end range L shoulder abduction noted as pinching   UPPER EXTREMITY MMT:   Active MMT Right eval Left eval  Shoulder flexion 5 4+  Shoulder extension    Shoulder abduction 4+ 4-  Shoulder adduction    Shoulder internal rotation 4+ 4+  Shoulder external rotation 4 4+  Elbow flexion    Elbow extension    Wrist flexion    Wrist extension    Wrist ulnar deviation    Wrist radial deviation    Wrist pronation    Wrist supination    grip 46lb 34lb   (Blank rows = not tested)   SHOULDER SPECIAL TESTS: Impingement tests: deferred Rotator cuff assessment: Gerber lift off test: negative  TREATMENT DATE:   [facility] Adult PT Treatment:                                                DATE: 02/13/2024   Initial evaluation: see patient education and home exercise program as noted below     PATIENT EDUCATION: Education details: reviewed  initial home exercise program; discussion of POC, prognosis and goals for skilled PT   Person educated: Patient Education method: Explanation, Demonstration, and Handouts Education comprehension: verbalized understanding, returned demonstration, and needs further education  HOME EXERCISE PROGRAM: Access Code: [email or web address] URL: [email or web address] Date: 02/13/2024 Prepared by: Marko Molt  Exercises - Isometric Shoulder Flexion at Wall  - 1 x daily - 7 x weekly - 2 sets - 10 reps - 3-5 sec hold - Standing Isometric Shoulder Internal Rotation at Doorway  - 1 x daily - 7 x weekly - 2 sets - 10 reps - 3-5 sec hold - Isometric Shoulder Extension at Wall  - 1 x daily - 7 x weekly - 2 sets - 10 reps - 3-5 sec hold - Standing Isometric Shoulder External Rotation with Doorway  - 1 x daily - 7 x weekly - 2 sets - 10 reps - 3-5 sec hold  ASSESSMENT:  CLINICAL IMPRESSION: Hennessy is a 23 y.o. female who was seen today for physical therapy evaluation and treatment for L shoulder pain that began after a heavy item fell onto her L shoulder while at work. She is demonstrating ful L shouler AROM, decreased L shoulder strength, and decreased postural mm endurance. She has related pain and difficulty with heavy lifting, overhead reaching, overhead lifting, prolonged carrying of tablet at work, along with difficulty with any activities that requires force or impact through her arm. She is limited with her work related activities. She requires skilled PT services at this time to address relevant deficits and improve overall function.     OBJECTIVE IMPAIRMENTS: decreased endurance, decreased strength, impaired UE functional use, improper body mechanics, postural dysfunction, and pain.   ACTIVITY LIMITATIONS: carrying, lifting, sleeping, and reach over head  PARTICIPATION LIMITATIONS: meal prep, cleaning, and occupation  PERSONAL FACTORS: Past/current experiences and Time since onset of  injury/illness/exacerbation are also affecting patient's functional outcome.   REHAB POTENTIAL: Good  CLINICAL DECISION MAKING: Stable/uncomplicated  EVALUATION COMPLEXITY: Low   GOALS: Goals reviewed with patient? YES  SHORT TERM GOALS: Target date: 03/06/2024   Patient will be independent with initial home program at least 3 days/week.  Baseline: provided at eval Goal Status: INITIAL   2.  Patient will demonstrate improved postural awareness for at least 15 minutes while seated without need for cueing from PT.  Baseline: see objective measures Goal Status: INITIAL   3.  Patient will demonstrate improved shoulder abduction strength to 4+/5 MMT or greater. Baseline: 4-/5 Goal status: INITIAL   LONG TERM GOALS: Target date: 03/27/2024    Patient will report improved overall functional ability with Quick Dash score of 14 or less.  Baseline: 24 Goal Status: INITIAL    2.  Patient will demonstrate ability to perform floor to waist lifting of at least 30# using appropriate body mechanics and with no more than minimal pain in order to safely perform normal daily/occupational tasks.   Baseline: occasionally lifting about 24lb of lemonade with difficulty   Goal Status: INITIAL    3.  Patient will demonstrate ability to perform overhead lifting of at least 10# using appropriate body mechanics and with no more than minimal pain in order to safely perform normal daily/occupational tasks.   Baseline: pain and difficulty   Goal Status: INITIAL    4.  Patient will report 3/10 pain at worst with daily activities, including light-to-moderate household chores.  Baseline: 6-8/10  Goal status: INITIAL    PLAN:  PT FREQUENCY: 1-2x/week  PT DURATION: 6 weeks  PLANNED INTERVENTIONS: 02835- PT Re-evaluation, 97750- Physical Performance Testing, 97110-Therapeutic exercises, 97530- Therapeutic activity, [id number]- Neuromuscular re-education, 97535- Self Care, 02859- Manual therapy, G0283-  Electrical stimulation (unattended), [phone]- Ionotophoresis 4mg /ml Dexamethasone, Patient/Family education, Taping, Joint mobilization, Cryotherapy, and Moist heat  PLAN FOR NEXT SESSION: continue with shoulder isometrics and PNF progression, UBE, postural strengthening and periscapular mm endurance, gradually progress with RTC strengthening as tolerated; consider manual therapy and modalities as indicated    Marko Molt, PT, DPT  02/18/2024 9:17 AM

## 2024-02-18 NOTE — Addendum Note (Signed)
 Addended by: JOSHUA GUN on: 02/18/2024 09:19 AM   Modules accepted: Orders

## 2024-03-05 ENCOUNTER — Ambulatory Visit: Attending: Surgical

## 2024-03-05 DIAGNOSIS — M25512 Pain in left shoulder: Secondary | ICD-10-CM | POA: Diagnosis present

## 2024-03-05 DIAGNOSIS — M25612 Stiffness of left shoulder, not elsewhere classified: Secondary | ICD-10-CM | POA: Diagnosis present

## 2024-03-05 DIAGNOSIS — M6281 Muscle weakness (generalized): Secondary | ICD-10-CM | POA: Diagnosis present

## 2024-03-05 NOTE — Therapy (Signed)
 OUTPATIENT PHYSICAL THERAPY SHOULDER NOTE   Patient Name: Denise Hendrix MRN: [id number] DOB:[birth year], 23 y.o., female Today's Date: 03/05/2024  END OF SESSION:   PT End of Session - 03/05/24 1534     Visit Number 2    Number of Visits 12    Date for PT Re-Evaluation 03/27/24    Authorization Type [employer]    PT Start Time 1535    PT Stop Time 1613    PT Time Calculation (min) 38 min    Activity Tolerance Patient tolerated treatment well    Behavior During Therapy [facility] for tasks assessed/performed           No past medical history on file. No past surgical history on file. There are no active problems to display for this patient.   PCP: Dow Ozell PARAS, PA  REFERRING PROVIDER:  Shirly Carlin CROME, PA-C   REFERRING DIAG:  [phone] (ICD-10-CM) - Acute pain of left shoulder  Left shoulder PT 1-2x/week x 6 weeks Focus on strengthening exercises, symptomatic labral pathology  THERAPY DIAG:  Left shoulder pain, unspecified chronicity  Muscle weakness (generalized)  Stiffness of left shoulder, not elsewhere classified  Rationale for Evaluation and Treatment: Rehabilitation  ONSET DATE: 11/19/2023   SUBJECTIVE:                                                                                                                                                                                      SUBJECTIVE STATEMENT:   03/05/2024: Patient reporting that she is still not lifting much. Experiencing moderate pain today.   Patient reports to PT with L shoulder pain that has been present since something fell onto the back of L shoulder. She has trouble holding tablet for extended periods of time while working at [employer]. She states that her arm will give out on her, and that weakness is a bigger problem for her than pain. She would like to be able to fulfill all her work requirements including lifting 35lbs. Still lifting 3 gallons of lemonade; not lifting boxes  of ice cream 45-48lb.   Hand dominance: Right   PERTINENT HISTORY: No relevant PMHx   PAIN:  Are you having pain? Yes: NPRS scale: 6/10 current, 8/10 worst Pain location: posterior L shoulder Pain description: heavy, tense, pinching (with lifting)  Aggravating factors: lifting, OH reaching lifting, sporadic movements  Relieving factors: Voltaren gel, aleve , tylenol     PRECAUTIONS: None  RED FLAGS: None   WEIGHT BEARING RESTRICTIONS: No  FALLS:  Has patient fallen in last 6 months? No  LIVING ENVIRONMENT: Lives with: lives with their family  OCCUPATION: Team Member/Front of Technical brewer at [employer]  PLOF: Independent  PATIENT GOALS: Patient would like to have the clicking and popping decreasing; and be able to lift at least 35lb  NEXT MD VISIT:   OBJECTIVE:  Note: Objective measures were completed at Evaluation unless otherwise noted.  DIAGNOSTIC FINDINGS:  11/30/2023 L shoulder XRAY  AP, Scap Y, axillary views of left shoulder reviewed.  No fracture or  dislocation.  No abnormality noted to the glenohumeral or  acromioclavicular joints.  No abnormal acromiohumeral interval   PATIENT SURVEYS:  Quick Dash:  QUICK DASH  Please rate your ability do the following activities in the last week by selecting the number below the appropriate response.   Activities Rating  Open a tight or new jar.  3 = Moderate difficulty  Do heavy household chores (e.g., wash walls, floors). 3 = Moderate difficulty  Carry a shopping bag or briefcase 2 = Mild difficulty  Wash your back. 1 = No difficulty   Use a knife to cut food. 1 = No difficulty   Recreational activities in which you take some force or impact through your arm, shoulder or hand (e.g., golf, hammering, tennis, etc.). 3 = Moderate difficulty  During the past week, to what extent has your arm, shoulder or hand problem interfered with your normal social activities with family, friends, neighbors or groups?  3 =  Moderately  During the past week, were you limited in your work or other regular daily activities as a result of your arm, shoulder or hand problem? 3 = Moderately limited  Rate the severity of the following symptoms in the last week: Arm, Shoulder, or hand pain. 3 = Moderate  Rate the severity of the following symptoms in the last week: Tingling (pins and needles) in your arm, shoulder or hand. 1 = none  During the past week, how much difficulty have you had sleeping because of the pain in your arm, shoulder or hand?  2 = Mild difficulty   (A QuickDASH score may not be calculated if there is greater than 1 missing item.)  Quick Dash Disability/Symptom Score: 24  Minimally Clinically Important Difference (MCID): 15-20 points  Flavio, F. et al. (2013). Minimally clinically important difference of the disabilities of the arm, shoulder, and hand outcome measures (DASH) and its shortened version (Quick DASH). Journal of Orthopaedic & Sports Physical Therapy, 44(1), 30-39)   COGNITION: Overall cognitive status: Within functional limits for tasks assessed     SENSATION: Not tested  POSTURE: Mild shoulder rounding, mild forward  UPPER EXTREMITY ROM: WFL BIL, pain with end range L shoulder abduction noted as pinching   UPPER EXTREMITY MMT:   Active MMT Right eval Left eval  Shoulder flexion 5 4+  Shoulder extension    Shoulder abduction 4+ 4-  Shoulder adduction    Shoulder internal rotation 4+ 4+  Shoulder external rotation 4 4+  Elbow flexion    Elbow extension    Wrist flexion    Wrist extension    Wrist ulnar deviation    Wrist radial deviation    Wrist pronation    Wrist supination    grip 46lb 34lb   (Blank rows = not tested)   SHOULDER SPECIAL TESTS: Impingement tests: deferred Rotator cuff assessment: Gerber lift off test: negative  TREATMENT DATE:    [facility] Adult PT Treatment:                                                DATE: 03/05/2024  Therapeutic Exercise: UBE 3 min  Finger Ladder flexion x 6  Finger Ladder abduction x 6  Pball x 8 flexion, x 8 L diagonal  L shoulder isometrics 3 sec x 10 each (flexion, extension, abduction)  L shoulder adduction isometric, pillow squeeze, 3 sec x 10  Seated flexion 2lb x 15  Seated scaptions 2lb x 15    [facility] Adult PT Treatment:                                                DATE: 02/13/2024   Initial evaluation: see patient education and home exercise program as noted below     PATIENT EDUCATION: Education details: reviewed initial home exercise program; discussion of POC, prognosis and goals for skilled PT   Person educated: Patient Education method: Explanation, Demonstration, and Handouts Education comprehension: verbalized understanding, returned demonstration, and needs further education  HOME EXERCISE PROGRAM: Access Code: [email or web address] URL: [email or web address] Date: 02/13/2024 Prepared by: Marko Molt  Exercises - Isometric Shoulder Flexion at Wall  - 1 x daily - 7 x weekly - 2 sets - 10 reps - 3-5 sec hold - Standing Isometric Shoulder Internal Rotation at Doorway  - 1 x daily - 7 x weekly - 2 sets - 10 reps - 3-5 sec hold - Isometric Shoulder Extension at Wall  - 1 x daily - 7 x weekly - 2 sets - 10 reps - 3-5 sec hold - Standing Isometric Shoulder External Rotation with Doorway  - 1 x daily - 7 x weekly - 2 sets - 10 reps - 3-5 sec hold  ASSESSMENT:  CLINICAL IMPRESSION: 03/05/24 Laurajean had good tolerance of initial treatment session. Tactile and Verbal cues required for instruction. Patient had significant mm fatigue at end of session, but denies worsening of symptoms. Patient requires ongoing skilled PT intervention to address current impairments and related functional deficits. We will continue to progress as tolerated.      EVAL: Dorisann is a 23 y.o. female who was seen today for physical therapy evaluation and treatment for L shoulder pain that began after a heavy item fell onto her L shoulder while at work. She is demonstrating ful L shouler AROM, decreased L shoulder strength, and decreased postural mm endurance. She has related pain and difficulty with heavy lifting, overhead reaching, overhead lifting, prolonged carrying of tablet at work, along with difficulty with any activities that requires force or impact through her arm. She is limited with her work related activities. She requires skilled PT services at this time to address relevant deficits and improve overall function.     OBJECTIVE IMPAIRMENTS: decreased endurance, decreased strength, impaired UE functional use, improper body mechanics, postural dysfunction, and pain.   ACTIVITY LIMITATIONS: carrying, lifting, sleeping, and reach over head  PARTICIPATION LIMITATIONS: meal prep, cleaning, and occupation  PERSONAL FACTORS: Past/current experiences and Time since onset of injury/illness/exacerbation are also affecting patient's functional outcome.   REHAB POTENTIAL: Good  CLINICAL DECISION MAKING: Stable/uncomplicated  EVALUATION COMPLEXITY: Low   GOALS: Goals reviewed with  patient? YES  SHORT TERM GOALS: Target date: 03/06/2024   Patient will be independent with initial home program at least 3 days/week.  Baseline: provided at eval Goal Status: INITIAL   2.  Patient will demonstrate improved postural awareness for at least 15 minutes while seated without need for cueing from PT.  Baseline: see objective measures Goal Status: INITIAL   3.  Patient will demonstrate improved shoulder abduction strength to 4+/5 MMT or greater. Baseline: 4-/5 Goal status: INITIAL   LONG TERM GOALS: Target date: 03/27/2024    Patient will report improved overall functional ability with Quick Dash score of 14 or less.  Baseline: 24 Goal Status:  INITIAL    2.  Patient will demonstrate ability to perform floor to waist lifting of at least 30# using appropriate body mechanics and with no more than minimal pain in order to safely perform normal daily/occupational tasks.   Baseline: occasionally lifting about 24lb of lemonade with difficulty   Goal Status: INITIAL    3.  Patient will demonstrate ability to perform overhead lifting of at least 10# using appropriate body mechanics and with no more than minimal pain in order to safely perform normal daily/occupational tasks.   Baseline: pain and difficulty   Goal Status: INITIAL    4.  Patient will report 3/10 pain at worst with daily activities, including light-to-moderate household chores.  Baseline: 6-8/10  Goal status: INITIAL    PLAN:  PT FREQUENCY: 1-2x/week  PT DURATION: 6 weeks  PLANNED INTERVENTIONS: 02835- PT Re-evaluation, 97750- Physical Performance Testing, 97110-Therapeutic exercises, 97530- Therapeutic activity, [id number]- Neuromuscular re-education, 97535- Self Care, 02859- Manual therapy, G0283- Electrical stimulation (unattended), [phone]- Ionotophoresis 4mg /ml Dexamethasone, Patient/Family education, Taping, Joint mobilization, Cryotherapy, and Moist heat  PLAN FOR NEXT SESSION: continue with shoulder isometrics and PNF progression, UBE, postural strengthening and periscapular mm endurance, gradually progress with RTC strengthening as tolerated; consider manual therapy and modalities as indicated    Marko Molt, PT, DPT  03/06/2024 8:30 AM

## 2024-03-10 ENCOUNTER — Ambulatory Visit: Admitting: Surgical

## 2024-03-10 ENCOUNTER — Ambulatory Visit (INDEPENDENT_AMBULATORY_CARE_PROVIDER_SITE_OTHER): Admitting: Surgical

## 2024-03-10 DIAGNOSIS — S43432D Superior glenoid labrum lesion of left shoulder, subsequent encounter: Secondary | ICD-10-CM

## 2024-03-12 ENCOUNTER — Ambulatory Visit

## 2024-03-13 ENCOUNTER — Ambulatory Visit

## 2024-03-13 DIAGNOSIS — M6281 Muscle weakness (generalized): Secondary | ICD-10-CM

## 2024-03-13 DIAGNOSIS — M25612 Stiffness of left shoulder, not elsewhere classified: Secondary | ICD-10-CM

## 2024-03-13 DIAGNOSIS — M25512 Pain in left shoulder: Secondary | ICD-10-CM | POA: Diagnosis not present

## 2024-03-13 NOTE — Therapy (Signed)
 OUTPATIENT PHYSICAL THERAPY SHOULDER NOTE   Patient Name: Denise Hendrix MRN: [id number] DOB:19-Dec-2000, 23 y.o., female Today's Date: 03/13/2024  END OF SESSION:  PT End of Session - 03/13/24 1518     Visit Number 3    Number of Visits 12    Date for PT Re-Denise Hendrix 03/27/24    Authorization Type [employer]    PT Start Time 1452    PT Stop Time 1530    PT Time Calculation (min) 38 min    Activity Tolerance Patient tolerated treatment well    Behavior During Therapy [facility] for tasks assessed/performed           No past medical history on file. No past surgical history on file. There are no active problems to display for this patient.   PCP: Dow Ozell PARAS, PA  REFERRING PROVIDER:  Shirly Carlin CROME, PA-C   REFERRING DIAG:  [phone] (ICD-10-CM) - Acute pain of left shoulder  Left shoulder PT 1-2x/week x 6 weeks Focus on strengthening exercises, symptomatic labral pathology  THERAPY DIAG:  Left shoulder pain, unspecified chronicity  Muscle weakness (generalized)  Stiffness of left shoulder, not elsewhere classified  Rationale for Denise Hendrix and Treatment: Rehabilitation  ONSET DATE: 11/19/2023   SUBJECTIVE:                                                                                                                                                                                      SUBJECTIVE STATEMENT:   03/13/2024: Patient presents to today's session with acute distress and is tearful related to work stress. States that she has increased back pain today, and it was exacerbated by being positioned to do a lot of reaching up and forward today.    Patient reports to PT with L shoulder pain that has been present since something fell onto the back of L shoulder. She has trouble holding tablet for extended periods of time while working at [employer]. She states that her arm will give out on her, and that weakness is a bigger problem for her than pain.  She would like to be able to fulfill all her work requirements including lifting 35lbs. Still lifting 3 gallons of lemonade; not lifting boxes of ice cream 45-48lb.   Hand dominance: Right   PERTINENT HISTORY: No relevant PMHx   PAIN:  Are you having pain? Yes: NPRS scale: 6/10 current, 8/10 worst Pain location: posterior L shoulder Pain description: heavy, tense, pinching (with lifting)  Aggravating factors: lifting, OH reaching lifting, sporadic movements  Relieving factors: Voltaren gel, aleve , tylenol     PRECAUTIONS: None  RED FLAGS: None   WEIGHT BEARING RESTRICTIONS: No  FALLS:  Has patient fallen in last 6 months? 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  DIAGNOSTIC FINDINGS:  11/30/2023 L shoulder XRAY  AP, Scap Y, axillary views of left shoulder reviewed.  No fracture or  dislocation.  No abnormality noted to the glenohumeral or  acromioclavicular joints.  No abnormal acromiohumeral interval   PATIENT SURVEYS:  Quick Dash:  QUICK DASH  Please rate your ability do the following activities in the last week by selecting the number below the appropriate response.   Activities Rating  Open a tight or new jar.  3 = Moderate difficulty  Do heavy household chores (e.g., wash walls, floors). 3 = Moderate difficulty  Carry a shopping bag or briefcase 2 = Mild difficulty  Wash your back. 1 = No difficulty   Use a knife to cut food. 1 = No difficulty   Recreational activities in which you take some force or impact through your arm, shoulder or hand (e.g., golf, hammering, tennis, etc.). 3 = Moderate difficulty  During the past week, to what  extent has your arm, shoulder or hand problem interfered with your normal social activities with family, friends, neighbors or groups?  3 = Moderately  During the past week, were you limited in your work or other regular daily activities as a result of your arm, shoulder or hand problem? 3 = Moderately limited  Rate the severity of the following symptoms in the last week: Arm, Shoulder, or hand pain. 3 = Moderate  Rate the severity of the following symptoms in the last week: Tingling (pins and needles) in your arm, shoulder or hand. 1 = none  During the past week, how much difficulty have you had sleeping because of the pain in your arm, shoulder or hand?  2 = Mild difficulty   (A QuickDASH score may not be calculated if there is greater than 1 missing item.)  Quick Dash Disability/Symptom Score: 24  Minimally Clinically Important Difference (MCID): 15-20 points  Flavio, F. et al. (2013). Minimally clinically important difference of the disabilities of the arm, shoulder, and hand outcome measures (DASH) and its shortened version (Quick DASH). Journal of Orthopaedic & Sports Physical Therapy, 44(1), 30-39)   COGNITION: Overall cognitive status: Within functional limits for tasks assessed     SENSATION: Not tested  POSTURE: Mild shoulder rounding, mild forward  UPPER EXTREMITY ROM: WFL BIL, pain with end range L shoulder abduction noted as pinching   UPPER EXTREMITY MMT:   Active MMT Right eval Left eval  Shoulder flexion 5 4+  Shoulder extension    Shoulder abduction 4+ 4-  Shoulder adduction    Shoulder internal rotation 4+ 4+  Shoulder external rotation 4 4+  Elbow flexion    Elbow extension    Wrist flexion    Wrist extension    Wrist ulnar deviation    Wrist radial deviation    Wrist pronation    Wrist supination    grip 46lb 34lb   (Blank rows = not tested)   SHOULDER SPECIAL TESTS: Impingement tests: deferred Rotator cuff assessment: Gerber lift off  test: negative  TREATMENT DATE:    [facility] Adult PT Treatment:                                                DATE: 03/13/2024  Therapeutic Exercise: Pball x 8 flexion, x 8 L diagonal  Seated TS/LS extension with soft foam roller 2 x 10, with cervical extension   Self-Care:  Patient education regarding conservative pain management and stress management strategies to improve pain severity and nervous system regulation; includes progressive mm relaxation, breathing exercises, low-intensity walk (10-15 minutes), other existing strategies for decompression following stressful work day.    [facility] Adult PT Treatment:                                                DATE: 03/05/2024  Therapeutic Exercise: UBE 3 min  Finger Ladder flexion x 6  Finger Ladder abduction x 6  Pball x 8 flexion, x 8 L diagonal  L shoulder isometrics 3 sec x 10 each (flexion, extension, abduction)  L shoulder adduction isometric, pillow squeeze, 3 sec x 10  Seated flexion 2lb x 15  Seated scaptions 2lb x 15        PATIENT EDUCATION: Education details: reviewed initial home exercise program; discussion of POC, prognosis and goals for skilled PT   Person educated: Patient Education method: Explanation, Demonstration, and Handouts Education comprehension: verbalized understanding, returned demonstration, and needs further education  HOME EXERCISE PROGRAM: Access Code: [email or web address] URL: [email or web address] Date: 03/13/2024 Prepared by: Marko Molt  Exercises - Isometric Shoulder Flexion at Wall  - 1 x daily - 7 x weekly - 2 sets - 10 reps - 3-5 sec hold - Standing Isometric Shoulder Internal Rotation at Doorway  - 1 x daily - 7 x weekly - 2 sets - 10 reps - 3-5 sec hold - Isometric Shoulder Extension at Wall  - 1 x daily - 7 x weekly - 2 sets - 10 reps - 3-5 sec hold -  Standing Isometric Shoulder External Rotation with Doorway  - 1 x daily - 7 x weekly - 2 sets - 10 reps - 3-5 sec hold - 15 Minute Progressive Muscle Relaxation for Pain Relief   ASSESSMENT:  CLINICAL IMPRESSION: 03/13/24 Additional time spent with subjective assessment today d/t acute work-related distress prior to today's session. She had concurrent increase in pain that feels more limited today. Additional time spent today with patient education to address stress management and related pain modulation strategies.     EVAL: Ashtan is a 23 y.o. female who was seen today for physical therapy Denise Hendrix and treatment for L shoulder pain that began after a heavy item fell onto her L shoulder while at work. She is demonstrating ful L shouler AROM, decreased L shoulder strength, and decreased postural mm endurance. She has related pain and difficulty with heavy lifting, overhead reaching, overhead lifting, prolonged carrying of tablet at work, along with difficulty with any activities that requires force or impact through her arm. She is limited with her work related activities. She requires skilled PT services at this time to address relevant deficits and improve overall function.     OBJECTIVE IMPAIRMENTS: decreased endurance, decreased strength, impaired UE functional use, improper body mechanics, postural dysfunction, and  pain.   ACTIVITY LIMITATIONS: carrying, lifting, sleeping, and reach over head  PARTICIPATION LIMITATIONS: meal prep, cleaning, and occupation  PERSONAL FACTORS: Past/current experiences and Time since onset of injury/illness/exacerbation are also affecting patient's functional outcome.   REHAB POTENTIAL: Good  CLINICAL DECISION MAKING: Stable/uncomplicated  Denise Hendrix COMPLEXITY: Low   GOALS: Goals reviewed with patient? YES  SHORT TERM GOALS: Target date: 03/06/2024   Patient will be independent with initial home program at least 3 days/week.  Baseline:  provided at eval Goal Status: INITIAL   2.  Patient will demonstrate improved postural awareness for at least 15 minutes while seated without need for cueing from PT.  Baseline: see objective measures Goal Status: INITIAL   3.  Patient will demonstrate improved shoulder abduction strength to 4+/5 MMT or greater. Baseline: 4-/5 Goal status: INITIAL   LONG TERM GOALS: Target date: 03/27/2024    Patient will report improved overall functional ability with Quick Dash score of 14 or less.  Baseline: 24 Goal Status: INITIAL    2.  Patient will demonstrate ability to perform floor to waist lifting of at least 30# using appropriate body mechanics and with no more than minimal pain in order to safely perform normal daily/occupational tasks.   Baseline: occasionally lifting about 24lb of lemonade with difficulty   Goal Status: INITIAL    3.  Patient will demonstrate ability to perform overhead lifting of at least 10# using appropriate body mechanics and with no more than minimal pain in order to safely perform normal daily/occupational tasks.   Baseline: pain and difficulty   Goal Status: INITIAL    4.  Patient will report 3/10 pain at worst with daily activities, including light-to-moderate household chores.  Baseline: 6-8/10  Goal status: INITIAL    PLAN:  PT FREQUENCY: 1-2x/week  PT DURATION: 6 weeks  PLANNED INTERVENTIONS: 02835- PT Re-Denise Hendrix, 97750- Physical Performance Testing, 97110-Therapeutic exercises, 97530- Therapeutic activity, [id number]- Neuromuscular re-education, 97535- Self Care, 02859- Manual therapy, G0283- Electrical stimulation (unattended), [phone]- Ionotophoresis 4mg /ml Dexamethasone, Patient/Family education, Taping, Joint mobilization, Cryotherapy, and Moist heat  PLAN FOR NEXT SESSION: continue with shoulder isometrics and PNF progression, UBE, postural strengthening and periscapular mm endurance, gradually progress with RTC strengthening as tolerated;  consider manual therapy and modalities as indicated    Marko Molt, PT, DPT  03/13/2024 5:37 PM

## 2024-03-19 ENCOUNTER — Ambulatory Visit: Attending: Surgical

## 2024-03-19 DIAGNOSIS — M6281 Muscle weakness (generalized): Secondary | ICD-10-CM | POA: Diagnosis present

## 2024-03-19 DIAGNOSIS — M25512 Pain in left shoulder: Secondary | ICD-10-CM | POA: Insufficient documentation

## 2024-03-19 DIAGNOSIS — M25612 Stiffness of left shoulder, not elsewhere classified: Secondary | ICD-10-CM | POA: Diagnosis present

## 2024-03-19 NOTE — Therapy (Signed)
 OUTPATIENT PHYSICAL THERAPY SHOULDER NOTE   Patient Name: Denise Hendrix MRN: [id number] DOB:23-Feb-2001, 23 y.o., female Today's Date: 03/19/2024  END OF SESSION:  PT End of Session - 03/19/24 1532     Visit Number 4    Number of Visits 12    Date for PT Re-Evaluation 03/27/24    Authorization Type [employer]    PT Start Time 1531    PT Stop Time 1609    PT Time Calculation (min) 38 min    Activity Tolerance Patient limited by pain;Patient tolerated treatment well    Behavior During Therapy [facility] for tasks assessed/performed          History reviewed. No pertinent past medical history. History reviewed. No pertinent surgical history. There are no active problems to display for this patient.   PCP: Dow Ozell PARAS, PA  REFERRING PROVIDER:  Shirly Carlin CROME, PA-C   REFERRING DIAG:  [phone] (ICD-10-CM) - Acute pain of left shoulder  Left shoulder PT 1-2x/week x 6 weeks Focus on strengthening exercises, symptomatic labral pathology  THERAPY DIAG:  Left shoulder pain, unspecified chronicity  Stiffness of left shoulder, not elsewhere classified  Muscle weakness (generalized)  Rationale for Evaluation and Treatment: Rehabilitation  ONSET DATE: 11/19/2023   SUBJECTIVE:                                                                                                                                                                                      SUBJECTIVE STATEMENT: Patient reports that she was in a lot of pain yesterday, 9/10 pain, rating at a 6/10 today.  EVAL: Patient reports to PT with L shoulder pain that has been present since something fell onto the back of L shoulder. She has trouble holding tablet for extended periods of time while working at [employer]. She states that her arm will give out on her, and that weakness is a bigger problem for her than pain. She would like to be able to fulfill all her work requirements including lifting 35lbs. Still  lifting 3 gallons of lemonade; not lifting boxes of ice cream 45-48lb.   Hand dominance: Right   PERTINENT HISTORY: No relevant PMHx   PAIN:  Are you having pain? Yes: NPRS scale: 6/10 current, 8/10 worst Pain location: posterior L shoulder Pain description: heavy, tense, pinching (with lifting)  Aggravating factors: lifting, OH reaching lifting, sporadic movements  Relieving factors: Voltaren gel, aleve , tylenol     PRECAUTIONS: None  RED FLAGS: None   WEIGHT BEARING RESTRICTIONS: No  FALLS:  Has patient fallen in last 6 months? 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  DIAGNOSTIC FINDINGS:  11/30/2023 L shoulder XRAY  AP, Scap Y, axillary views of left shoulder reviewed.  No fracture or  dislocation.  No abnormality noted to the glenohumeral or  acromioclavicular joints.  No abnormal acromiohumeral interval   PATIENT SURVEYS:  Quick Dash:  QUICK DASH  Please rate your ability do the following activities in the last week by selecting the number below the appropriate response.   Activities Rating  Open a tight or new jar.  3 = Moderate difficulty  Do heavy household chores (e.g., wash walls, floors). 3 = Moderate difficulty  Carry a shopping bag or briefcase 2 = Mild difficulty  Wash your back. 1 = No difficulty   Use a knife to cut food. 1 = No difficulty   Recreational activities in which you take some force or impact through your arm, shoulder or hand (e.g., golf, hammering, tennis, etc.). 3 = Moderate difficulty  During the past week, to what extent has your arm, shoulder or hand problem interfered with your normal social activities  with family, friends, neighbors or groups?  3 = Moderately  During the past week, were you limited in your work or other regular daily activities as a result of your arm, shoulder or hand problem? 3 = Moderately limited  Rate the severity of the following symptoms in the last week: Arm, Shoulder, or hand pain. 3 = Moderate  Rate the severity of the following symptoms in the last week: Tingling (pins and needles) in your arm, shoulder or hand. 1 = none  During the past week, how much difficulty have you had sleeping because of the pain in your arm, shoulder or hand?  2 = Mild difficulty   (A QuickDASH score may not be calculated if there is greater than 1 missing item.)  Quick Dash Disability/Symptom Score: 24  Minimally Clinically Important Difference (MCID): 15-20 points  Flavio, F. et al. (2013). Minimally clinically important difference of the disabilities of the arm, shoulder, and hand outcome measures (DASH) and its shortened version (Quick DASH). Journal of Orthopaedic & Sports Physical Therapy, 44(1), 30-39)   COGNITION: Overall cognitive status: Within functional limits for tasks assessed     SENSATION: Not tested  POSTURE: Mild shoulder rounding, mild forward  UPPER EXTREMITY ROM: WFL BIL, pain with end range L shoulder abduction noted as pinching   UPPER EXTREMITY MMT:   Active MMT Right eval Left eval  Shoulder flexion 5 4+  Shoulder extension    Shoulder abduction 4+ 4-  Shoulder adduction    Shoulder internal rotation 4+ 4+  Shoulder external rotation 4 4+  Elbow flexion    Elbow extension    Wrist flexion    Wrist extension    Wrist ulnar deviation    Wrist radial deviation    Wrist pronation    Wrist supination    grip 46lb 34lb   (Blank rows = not tested)   SHOULDER SPECIAL TESTS: Impingement tests: deferred Rotator cuff assessment: Gerber lift off test: negative  TREATMENT DATE:  [facility] Adult PT Treatment:                                                DATE: 03/19/24 Therapeutic Exercise: UBE level 1 2' fwd/bwd while gathering subjective and planning session with patient Pball x 10 flexion, x 10 L diagonal  Seated TS/LS extension with soft foam roller 2 x 10, with cervical extension Finger Ladder flexion x 6  Finger Ladder abduction x 6  L shoulder adduction isometric, pillow squeeze, 3 sec x 10  Seated flexion 2lb x 8 Seated scaptions 2lb x 6   [facility] Adult PT Treatment:                                                DATE: 03/13/24  Therapeutic Exercise: Pball x 8 flexion, x 8 L diagonal  Seated TS/LS extension with soft foam roller 2 x 10, with cervical extension   Self-Care:  Patient education regarding conservative pain management and stress management strategies to improve pain severity and nervous system regulation; includes progressive mm relaxation, breathing exercises, low-intensity walk (10-15 minutes), other existing strategies for decompression following stressful work day.     PATIENT EDUCATION: Education details: reviewed initial home exercise program; discussion of POC, prognosis and goals for skilled PT   Person educated: Patient Education method: Explanation, Demonstration, and Handouts Education comprehension: verbalized understanding, returned demonstration, and needs further education  HOME EXERCISE PROGRAM: Access Code: [email or web address] URL: [email or web address] Date: 03/13/2024 Prepared by: Marko Molt  Exercises - Isometric Shoulder Flexion at Wall  - 1 x daily - 7 x weekly - 2 sets - 10 reps - 3-5 sec hold - Standing Isometric Shoulder Internal Rotation at Doorway  - 1 x daily - 7 x weekly - 2 sets - 10 reps - 3-5 sec hold - Isometric Shoulder Extension at Wall  - 1 x daily - 7 x weekly - 2 sets - 10 reps - 3-5 sec hold - Standing Isometric Shoulder External  Rotation with Doorway  - 1 x daily - 7 x weekly - 2 sets - 10 reps - 3-5 sec hold - 15 Minute Progressive Muscle Relaxation for Pain Relief   ASSESSMENT:  CLINICAL IMPRESSION: 03/19/24 Patient presents to PT reporting increased pain yesterday in her shoulder that she rated at a 9/10 and made her very tearful. It is somewhat better today at a 6/10. Session today focused on gentle strengthening and shoulder ROM. Patient continues to benefit from skilled PT services and should be progressed as able to improve functional independence.    EVAL: Honestee is a 23 y.o. female who was seen today for physical therapy evaluation and treatment for L shoulder pain that began after a heavy item fell onto her L shoulder while at work. She is demonstrating ful L shouler AROM, decreased L shoulder strength, and decreased postural mm endurance. She has related pain and difficulty with heavy lifting, overhead reaching, overhead lifting, prolonged carrying of tablet at work, along with difficulty with any activities that requires force or impact through her arm. She is limited with her work related activities. She requires skilled PT services at this time to address relevant deficits and improve overall function.  OBJECTIVE IMPAIRMENTS: decreased endurance, decreased strength, impaired UE functional use, improper body mechanics, postural dysfunction, and pain.   ACTIVITY LIMITATIONS: carrying, lifting, sleeping, and reach over head  PARTICIPATION LIMITATIONS: meal prep, cleaning, and occupation  PERSONAL FACTORS: Past/current experiences and Time since onset of injury/illness/exacerbation are also affecting patient's functional outcome.   REHAB POTENTIAL: Good  CLINICAL DECISION MAKING: Stable/uncomplicated  EVALUATION COMPLEXITY: Low   GOALS: Goals reviewed with patient? YES  SHORT TERM GOALS: Target date: 03/06/2024   Patient will be independent with initial home program at least 3 days/week.   Baseline: provided at eval Goal Status: INITIAL   2.  Patient will demonstrate improved postural awareness for at least 15 minutes while seated without need for cueing from PT.  Baseline: see objective measures Goal Status: INITIAL   3.  Patient will demonstrate improved shoulder abduction strength to 4+/5 MMT or greater. Baseline: 4-/5 Goal status: INITIAL   LONG TERM GOALS: Target date: 03/27/2024    Patient will report improved overall functional ability with Quick Dash score of 14 or less.  Baseline: 24 Goal Status: INITIAL    2.  Patient will demonstrate ability to perform floor to waist lifting of at least 30# using appropriate body mechanics and with no more than minimal pain in order to safely perform normal daily/occupational tasks.   Baseline: occasionally lifting about 24lb of lemonade with difficulty   Goal Status: INITIAL    3.  Patient will demonstrate ability to perform overhead lifting of at least 10# using appropriate body mechanics and with no more than minimal pain in order to safely perform normal daily/occupational tasks.   Baseline: pain and difficulty   Goal Status: INITIAL    4.  Patient will report 3/10 pain at worst with daily activities, including light-to-moderate household chores.  Baseline: 6-8/10  Goal status: INITIAL    PLAN:  PT FREQUENCY: 1-2x/week  PT DURATION: 6 weeks  PLANNED INTERVENTIONS: 02835- PT Re-evaluation, 97750- Physical Performance Testing, 97110-Therapeutic exercises, 97530- Therapeutic activity, [id number]- Neuromuscular re-education, 97535- Self Care, 02859- Manual therapy, [id number]- Electrical stimulation (unattended), [phone]- Ionotophoresis 4mg /ml Dexamethasone, Patient/Family education, Taping, Joint mobilization, Cryotherapy, and Moist heat  PLAN FOR NEXT SESSION: continue with shoulder isometrics and PNF progression, UBE, postural strengthening and periscapular mm endurance, gradually progress with RTC strengthening as  tolerated; consider manual therapy and modalities as indicated    Corean Pouch PTA  03/19/2024 4:12 PM

## 2024-03-23 ENCOUNTER — Encounter: Payer: Self-pay | Admitting: Surgical

## 2024-03-23 NOTE — Progress Notes (Signed)
 Follow-up Office Visit Note   Patient: Denise Hendrix           Date of Birth: 21-Feb-2001           MRN: [id number] Visit Date: 03/10/2024 Requested by: Dow Ozell PARAS, PA [address] [place],  [place] [address] PCP: Patient, No Pcp Per  Subjective: Chief Complaint  Patient presents with   Left Shoulder - Pain    DOI 11/19/2023    HPI: Denise Hendrix is a 23 y.o. female who returns to the office for follow-up visit.    Plan at last visit was: Denise Hendrix is a 23 y.o. female who returns to the office for follow-up visit.  Plan from last visit was noted above in HPI.  They now return with continued left shoulder discomfort and weakness subjectively.  We discussed options available to patient such as AC joint injection versus MRI scan versus physical therapy formally.  She has had good success with PT in the past and with her anxiety about needles, she would like to avoid injection/MRI unless absolutely necessary.  We can try physical therapy to focus on rotator cuff strengthening exercises for potentially symptomatic labral pathology over the next 4 to 6 weeks.  Follow-up in 6 weeks for clinical recheck and if no improvement at that time, Denise Hendrix said that she would like to consider glenohumeral versus AC joint injection.  She does not have any symptoms of instability or apprehension noted on exam today.   Since then, patient notes she has only been able to make 2 sessions of physical therapy.  She has more scheduled in September.  Continues to describe popping and clicking.  Not take any medications for pain by mouth but uses occasional Voltaren gel.  Symptoms are worse in the morning.  She does note stiffness with immobility.              ROS: All systems reviewed are negative as they relate to the chief complaint within the history of present illness.  Patient denies fevers or chills.  Assessment & Plan: Visit Diagnoses: No diagnosis found.  Plan: Denise Hendrix is  a 23 y.o. female who returns to the office for follow-up visit.  Plan from last visit was noted above in HPI.  They now return with continued symptoms.  Has only had 2 sessions of PT.  Planning to do more in September.  Think it would be best to give her more chance of doing therapy and we will see her back in 6 weeks.  She is having a lot of of changes in her personal life that are somewhat interfering with her optimal shoulder rehab which is understandable.  She will return in 6 weeks and if no improvement at that time after more extensive PT, would consider either a left glenohumeral injection versus repeat MRI arthrogram.  She has a lot of anxiety about MRI scans.  Follow-Up Instructions: No follow-ups on file.   Orders:  No orders of the defined types were placed in this encounter.  No orders of the defined types were placed in this encounter.     Procedures: No procedures performed   Clinical Data: No additional findings.  Objective: Vital Signs: There were no vitals taken for this visit.  Physical Exam:  Constitutional: Patient appears well-developed HEENT:  Head: Normocephalic Eyes:EOM are normal Neck: Normal range of motion Cardiovascular: Normal rate Pulmonary/chest: Effort normal Neurologic: Patient is alert Skin: Skin is warm Psychiatric: Patient has normal mood and affect  Ortho Exam: Ortho exam demonstrates no substantial change compared to previous exam with 70 degrees X rotation, 120 degrees abduction, 170 degrees forward elevation passively and actively.  Mild to moderate tenderness over the bicipital groove.  Mild tenderness over the [facility] joint today.  Continued clicking and crepitus noted in the posterior aspect of the shoulder with axial load of the shoulder and rotation.  Intact rotator cuff strength rated 5/5.  Specialty Comments:  No specialty comments available.  Imaging: No results found.   PMFS History: There are no active problems to display for  this patient.  No past medical history on file.  No family history on file.  No past surgical history on file. Social History   Occupational History   Not on file  Tobacco Use   Smoking status: Every Day    Types: E-cigarettes   Smokeless tobacco: Not on file  Vaping Use   Vaping status: Some Days   Substances: Nicotine, Flavoring  Substance and Sexual Activity   Alcohol use: Never   Drug use: Never   Sexual activity: Not on file

## 2024-03-26 ENCOUNTER — Ambulatory Visit

## 2024-03-26 DIAGNOSIS — M6281 Muscle weakness (generalized): Secondary | ICD-10-CM

## 2024-03-26 DIAGNOSIS — M25612 Stiffness of left shoulder, not elsewhere classified: Secondary | ICD-10-CM

## 2024-03-26 DIAGNOSIS — M25512 Pain in left shoulder: Secondary | ICD-10-CM

## 2024-03-26 NOTE — Therapy (Signed)
 OUTPATIENT PHYSICAL THERAPY SHOULDER NOTE PROGRESS NOTE   Patient Name: Denise Hendrix MRN: [id number] DOB:23-May-2001, 23 y.o., female Today's Date: 03/26/2024  END OF SESSION:   PT End of Session - 03/26/24 1543     Visit Number 5    Number of Visits 12    Date for PT Re-Evaluation 03/27/24    Authorization Type [employer]    PT Start Time 1542    PT Stop Time 1613    PT Time Calculation (min) 31 min    Activity Tolerance Patient limited by pain;Patient tolerated treatment well    Behavior During Therapy [facility] for tasks assessed/performed           No past medical history on file. No past surgical history on file. There are no active problems to display for this patient.   PCP: Dow Ozell PARAS, PA  REFERRING PROVIDER:  Shirly Carlin CROME, PA-C   REFERRING DIAG:  [phone] (ICD-10-CM) - Acute pain of left shoulder  Left shoulder PT 1-2x/week x 6 weeks Focus on strengthening exercises, symptomatic labral pathology  THERAPY DIAG:  Left shoulder pain, unspecified chronicity  Stiffness of left shoulder, not elsewhere classified  Muscle weakness (generalized)  Rationale for Evaluation and Treatment: Rehabilitation  ONSET DATE: 11/19/2023   SUBJECTIVE:                                                                                                                                                                                      SUBJECTIVE STATEMENT: Patient reports that her pain is about 6/10 today. She was able to start lifting boxes of lemonade/tea at work (approx 3 gallons worth).  However, this has starting to make her pain worse. She feels that her mobility has improved, however her strength has begun to wean.   EVAL: Patient reports to PT with L shoulder pain that has been present since something fell onto the back of L shoulder. She has trouble holding tablet for extended periods of time while working at [employer]. She states that her arm will give  out on her, and that weakness is a bigger problem for her than pain. She would like to be able to fulfill all her work requirements including lifting 35lbs. Still lifting 3 gallons of lemonade; not lifting boxes of ice cream 45-48lb.   Hand dominance: Right   PERTINENT HISTORY: No relevant PMHx   PAIN:  Are you having pain? Yes: NPRS scale: 6/10 current, 8/10 worst Pain location: posterior L shoulder Pain description: heavy, tense, pinching (with lifting)  Aggravating factors: lifting, OH reaching lifting, sporadic movements  Relieving factors: Voltaren gel, aleve , tylenol     PRECAUTIONS: None  RED  FLAGS: None   WEIGHT BEARING RESTRICTIONS: No  FALLS:  Has patient fallen in last 6 months? No  LIVING ENVIRONMENT: Lives with: lives with their family  OCCUPATION: Team Member/Front of Technical brewer at [employer]   PLOF: Independent  PATIENT GOALS: Patient would like to have the clicking and popping decreasing; and be able to lift at least 35lb  NEXT MD VISIT:   OBJECTIVE:  Note: Objective measures were completed at Evaluation unless otherwise noted.  DIAGNOSTIC FINDINGS:  11/30/2023 L shoulder XRAY  AP, Scap Y, axillary views of left shoulder reviewed.  No fracture or  dislocation.  No abnormality noted to the glenohumeral or  acromioclavicular joints.  No abnormal acromiohumeral interval   PATIENT SURVEYS:  Quick Dash:  QUICK DASH  Please rate your ability do the following activities in the last week by selecting the number below the appropriate response.   Activities Rating 03/26/2024  Open a tight or new jar.  3 = Moderate difficulty 3  Do heavy household chores (e.g., wash walls, floors). 3 = Moderate difficulty 3  Carry a shopping bag or briefcase 2 = Mild difficulty 3  Wash your back. 1 = No difficulty  1  Use a knife to cut food. 1 = No difficulty  1  Recreational activities in which you take some force or impact through your arm, shoulder or hand (e.g.,  golf, hammering, tennis, etc.). 3 = Moderate difficulty 3  During the past week, to what extent has your arm, shoulder or hand problem interfered with your normal social activities with family, friends, neighbors or groups?  3 = Moderately 2  During the past week, were you limited in your work or other regular daily activities as a result of your arm, shoulder or hand problem? 3 = Moderately limited 2  Rate the severity of the following symptoms in the last week: Arm, Shoulder, or hand pain. 3 = Moderate 3  Rate the severity of the following symptoms in the last week: Tingling (pins and needles) in your arm, shoulder or hand. 1 = none 1  During the past week, how much difficulty have you had sleeping because of the pain in your arm, shoulder or hand?  2 = Mild difficulty 2   (A QuickDASH score may not be calculated if there is greater than 1 missing item.)  Quick Dash Disability/Symptom Score: 24  03/27/2024 29.5  Minimally Clinically Important Difference (MCID): 15-20 points  (Franchignoni, F. et al. (2013). Minimally clinically important difference of the disabilities of the arm, shoulder, and hand outcome measures (DASH) and its shortened version (Quick DASH). Journal of Orthopaedic & Sports Physical Therapy, 44(1), 30-39)   COGNITION: Overall cognitive status: Within functional limits for tasks assessed     SENSATION: Not tested  POSTURE: Mild shoulder rounding, mild forward  UPPER EXTREMITY ROM: WFL BIL, pain with end range L shoulder abduction noted as pinching   UPPER EXTREMITY MMT:   Active MMT Right eval Left eval Right 03/26/2024 Left 03/26/2024   Shoulder flexion 5 4+ 5 4+  Shoulder extension      Shoulder abduction 4+ 4- 4+ 4  Shoulder adduction      Shoulder internal rotation 4+ 4+ 4 4+  Shoulder external rotation 4 4+ 4 4+  Elbow flexion      Elbow extension      Wrist flexion      Wrist extension      Wrist ulnar deviation      Wrist radial deviation  Wrist pronation      Wrist supination      Grip strength  46lb 34lb  45lb  40lb   (Blank rows = not tested)   SHOULDER SPECIAL TESTS: Impingement tests: deferred Rotator cuff assessment: Gerber lift off test: negative                                                                                                                             TREATMENT DATE:   [facility] Adult PT Treatment:                                                DATE: 03/26/2024  Therapeutic Activity:  Reassessment of objective measures and subjective assessment regarding progress towards established goals and updated plan for addressing remaining deficits and rehab goals.    [facility] Adult PT Treatment:                                                DATE: 03/19/24 Therapeutic Exercise: UBE level 1 2' fwd/bwd while gathering subjective and planning session with patient Pball x 10 flexion, x 10 L diagonal  Seated TS/LS extension with soft foam roller 2 x 10, with cervical extension Finger Ladder flexion x 6  Finger Ladder abduction x 6  L shoulder adduction isometric, pillow squeeze, 3 sec x 10  Seated flexion 2lb x 8 Seated scaptions 2lb x 6   [facility] Adult PT Treatment:                                                DATE: 03/13/24  Therapeutic Exercise: Pball x 8 flexion, x 8 L diagonal  Seated TS/LS extension with soft foam roller 2 x 10, with cervical extension   Self-Care:  Patient education regarding conservative pain management and stress management strategies to improve pain severity and nervous system regulation; includes progressive mm relaxation, breathing exercises, low-intensity walk (10-15 minutes), other existing strategies for decompression following stressful work day.     PATIENT EDUCATION: Education details: reviewed initial home exercise program; discussion of POC, prognosis and goals for skilled PT   Person educated: Patient Education method: Explanation, Demonstration, and Handouts Education  comprehension: verbalized understanding, returned demonstration, and needs further education  HOME EXERCISE PROGRAM: Access Code: [email or web address] URL: [email or web address] Date: 03/13/2024 Prepared by: Marko Molt  Exercises - Isometric Shoulder Flexion at Wall  - 1 x daily - 7 x weekly - 2 sets - 10 reps - 3-5 sec hold - Standing Isometric Shoulder Internal Rotation at Doorway  - 1 x daily -  7 x weekly - 2 sets - 10 reps - 3-5 sec hold - Isometric Shoulder Extension at Wall  - 1 x daily - 7 x weekly - 2 sets - 10 reps - 3-5 sec hold - Standing Isometric Shoulder External Rotation with Doorway  - 1 x daily - 7 x weekly - 2 sets - 10 reps - 3-5 sec hold - 15 Minute Progressive Muscle Relaxation for Pain Relief   ASSESSMENT:  CLINICAL IMPRESSION: 03/26/24 Patient has attended 4 PT sessions to address L shoulder pain following work-related injury. She is demonstrating good improvement of UE strength and postural mm endurance. She additionally has made some improvement with lifting activities <20#. However, she continues to have moderate difficulty with waist to shoulder height lifting and overhead lifting in order to perform work-related lifting from 30-45lb. She requires ongoing skilled PT intervention in order to address remaining deficits and progress towards functional rehab goals.    EVAL: Denise Hendrix is a 23 y.o. female who was seen today for physical therapy evaluation and treatment for L shoulder pain that began after a heavy item fell onto her L shoulder while at work. She is demonstrating ful L shouler AROM, decreased L shoulder strength, and decreased postural mm endurance. She has related pain and difficulty with heavy lifting, overhead reaching, overhead lifting, prolonged carrying of tablet at work, along with difficulty with any activities that requires force or impact through her arm. She is limited with her work related activities. She requires skilled PT services at this  time to address relevant deficits and improve overall function.     OBJECTIVE IMPAIRMENTS: decreased endurance, decreased strength, impaired UE functional use, improper body mechanics, postural dysfunction, and pain.   ACTIVITY LIMITATIONS: carrying, lifting, sleeping, and reach over head  PARTICIPATION LIMITATIONS: meal prep, cleaning, and occupation  PERSONAL FACTORS: Past/current experiences and Time since onset of injury/illness/exacerbation are also affecting patient's functional outcome.   REHAB POTENTIAL: Good  CLINICAL DECISION MAKING: Stable/uncomplicated  EVALUATION COMPLEXITY: Low   GOALS: Goals reviewed with patient? YES  SHORT TERM GOALS: Target date: 03/06/2024   Patient will be independent with initial home program at least 3 days/week.  Baseline: provided at eval Goal Status: MET  2.  Patient will demonstrate improved postural awareness for at least 15 minutes while seated without need for cueing from PT.  Baseline: see objective measures Goal Status: MET  3.  Patient will demonstrate improved shoulder abduction strength to 4+/5 MMT or greater. Baseline: 4-/5 03/26/2024: see MMT chart above  Goal status: PROGRESSING   LONG TERM GOALS: Target date: 04/24/2024    Patient will report improved overall functional ability with Quick Dash score of 14 or less.  Baseline: 24 03/26/2024:  29.5 Goal Status: ONGOING   2.  Patient will demonstrate ability to perform floor to waist lifting of at least 40# using appropriate body mechanics and with no more than minimal pain in order to safely perform normal daily/occupational tasks.   Baseline: occasionally lifting about 24lb of lemonade with difficulty   03/26/2024: able to lift 20 lb from waist to shoulder height (more specific to work activity), but requires cueing for improved lifting mechanics   Goal Status: PROGRESSING; goal addended to be specific to work related goal of lifting 40# container of ice cream    3.   Patient will demonstrate ability to perform overhead lifting of at least 10# using appropriate body mechanics and with no more than minimal pain in order to safely perform normal daily/occupational  tasks.   Baseline: pain and difficulty   03/26/2024: able to perform BIL lifting up to 10# with minimal pain  Goal Status: PROGRESSING; goal addended to be specific to work related goal of lifting 20# container of lemonade/tea    4.  Patient will report 3/10 pain at worst with daily activities, including light-to-moderate household chores.  Baseline: 6-8/10  03/26/2024 6/10  Goal status:ONGOING     PLAN:  PT FREQUENCY: 1-2x/week  PT DURATION: 4 weeks  PLANNED INTERVENTIONS: 97164- PT Re-evaluation, 97750- Physical Performance Testing, 97110-Therapeutic exercises, 97530- Therapeutic activity, 97112- Neuromuscular re-education, 97535- Self Care, [id number]- Manual therapy, [id number]- Electrical stimulation (unattended), [phone]- Ionotophoresis 4mg /ml Dexamethasone, Patient/Family education, Taping, Joint mobilization, Cryotherapy, and Moist heat  PLAN FOR NEXT SESSION: progress functional strengthening, lifting, and repetitive reaching activities in order to return to PLOF related to work-related tasks.    Marko Molt, PT, DPT  03/27/2024 11:16 AM

## 2024-04-02 ENCOUNTER — Ambulatory Visit

## 2024-04-02 DIAGNOSIS — M25512 Pain in left shoulder: Secondary | ICD-10-CM

## 2024-04-02 DIAGNOSIS — M6281 Muscle weakness (generalized): Secondary | ICD-10-CM

## 2024-04-02 DIAGNOSIS — M25612 Stiffness of left shoulder, not elsewhere classified: Secondary | ICD-10-CM

## 2024-04-02 NOTE — Therapy (Signed)
 OUTPATIENT PHYSICAL THERAPY TREATMENT NOTE   Patient Name: Denise Hendrix MRN: [id number] DOB:Dec 01, 2000, 23 y.o., female Today's Date: 04/02/2024  END OF SESSION:   PT End of Session - 04/02/24 1546     Visit Number 6    Number of Visits 12    Date for PT Re-Evaluation 04/24/24    Authorization Type [employer]    PT Start Time 1545    PT Stop Time 1610    PT Time Calculation (min) 25 min    Activity Tolerance Patient limited by pain;Patient tolerated treatment well    Behavior During Therapy [facility] for tasks assessed/performed            No past medical history on file. No past surgical history on file. There are no active problems to display for this patient.   PCP: Dow Ozell PARAS, PA  REFERRING PROVIDER:  Shirly Carlin CROME, PA-C   REFERRING DIAG:  [phone] (ICD-10-CM) - Acute pain of left shoulder  Left shoulder PT 1-2x/week x 6 weeks Focus on strengthening exercises, symptomatic labral pathology  THERAPY DIAG:  Left shoulder pain, unspecified chronicity  Stiffness of left shoulder, not elsewhere classified  Muscle weakness (generalized)  Rationale for Evaluation and Treatment: Rehabilitation  ONSET DATE: 11/19/2023   SUBJECTIVE:                                                                                                                                                                                      SUBJECTIVE STATEMENT: Patient reports that she has been trying to lift lemonade and tea at work, but it seems to be getting more painful and difficulty.   EVAL: Patient reports to PT with L shoulder pain that has been present since something fell onto the back of L shoulder. She has trouble holding tablet for extended periods of time while working at [employer]. She states that her arm will give out on her, and that weakness is a bigger problem for her than pain. She would like to be able to fulfill all her work requirements including lifting  35lbs. Still lifting 3 gallons of lemonade; not lifting boxes of ice cream 45-48lb.   Hand dominance: Right   PERTINENT HISTORY: No relevant PMHx   PAIN:  Are you having pain? Yes: NPRS scale: 6/10 current, 8/10 worst Pain location: posterior L shoulder Pain description: heavy, tense, pinching (with lifting)  Aggravating factors: lifting, OH reaching lifting, sporadic movements  Relieving factors: Voltaren gel, aleve , tylenol     PRECAUTIONS: None  RED FLAGS: None   WEIGHT BEARING RESTRICTIONS: No  FALLS:  Has patient fallen in last 6 months? No  LIVING ENVIRONMENT: Lives with: lives  with their family  OCCUPATION: Team Member/Front of Technical brewer at [employer]   PLOF: Independent  PATIENT GOALS: Patient would like to have the clicking and popping decreasing; and be able to lift at least 35lb  NEXT MD VISIT:   OBJECTIVE:  Note: Objective measures were completed at Evaluation unless otherwise noted.  DIAGNOSTIC FINDINGS:  11/30/2023 L shoulder XRAY  AP, Scap Y, axillary views of left shoulder reviewed.  No fracture or  dislocation.  No abnormality noted to the glenohumeral or  acromioclavicular joints.  No abnormal acromiohumeral interval   PATIENT SURVEYS:  Quick Dash:  QUICK DASH  Please rate your ability do the following activities in the last week by selecting the number below the appropriate response.   Activities Rating 03/26/2024  Open a tight or new jar.  3 = Moderate difficulty 3  Do heavy household chores (e.g., wash walls, floors). 3 = Moderate difficulty 3  Carry a shopping bag or briefcase 2 = Mild difficulty 3  Wash your back. 1 = No difficulty  1  Use a knife to cut food. 1 = No difficulty  1  Recreational activities in which you take some force or impact through your arm, shoulder or hand (e.g., golf, hammering, tennis, etc.). 3 = Moderate difficulty 3  During the past week, to what extent has your arm, shoulder or hand problem interfered  with your normal social activities with family, friends, neighbors or groups?  3 = Moderately 2  During the past week, were you limited in your work or other regular daily activities as a result of your arm, shoulder or hand problem? 3 = Moderately limited 2  Rate the severity of the following symptoms in the last week: Arm, Shoulder, or hand pain. 3 = Moderate 3  Rate the severity of the following symptoms in the last week: Tingling (pins and needles) in your arm, shoulder or hand. 1 = none 1  During the past week, how much difficulty have you had sleeping because of the pain in your arm, shoulder or hand?  2 = Mild difficulty 2   (A QuickDASH score may not be calculated if there is greater than 1 missing item.)  Quick Dash Disability/Symptom Score: 24  04/02/2024 29.5  Minimally Clinically Important Difference (MCID): 15-20 points  (Franchignoni, F. et al. (2013). Minimally clinically important difference of the disabilities of the arm, shoulder, and hand outcome measures (DASH) and its shortened version (Quick DASH). Journal of Orthopaedic & Sports Physical Therapy, 44(1), 30-39)   COGNITION: Overall cognitive status: Within functional limits for tasks assessed     SENSATION: Not tested  POSTURE: Mild shoulder rounding, mild forward  UPPER EXTREMITY ROM: WFL BIL, pain with end range L shoulder abduction noted as pinching   UPPER EXTREMITY MMT:   Active MMT Right eval Left eval Right 03/26/2024 Left 03/26/2024   Shoulder flexion 5 4+ 5 4+  Shoulder extension      Shoulder abduction 4+ 4- 4+ 4  Shoulder adduction      Shoulder internal rotation 4+ 4+ 4 4+  Shoulder external rotation 4 4+ 4 4+  Elbow flexion      Elbow extension      Wrist flexion      Wrist extension      Wrist ulnar deviation      Wrist radial deviation      Wrist pronation      Wrist supination      Grip strength  46lb 34lb  45lb  40lb   (Blank rows = not tested)   SHOULDER SPECIAL  TESTS: Impingement tests: deferred Rotator cuff assessment: Gerber lift off test: negative                                                                                                                             TREATMENT DATE:   [facility] Adult PT Treatment:                                                DATE: 04/02/2024   Therapeutic Activity:  UBE level 1 2' fwd/bwd while gathering subjective and planning session with patient Pball x 10 flexion, x 10 L diagonal  Finger Ladder flexion x 6  Finger Ladder abduction x 6  Wall plank 2 x 20 sec Wall plank on small pball 2 x 20 sec with mild perturbations   [facility] Adult PT Treatment:                                                DATE: 03/26/2024  Therapeutic Activity:  Reassessment of objective measures and subjective assessment regarding progress towards established goals and updated plan for addressing remaining deficits and rehab goals.    [facility] Adult PT Treatment:                                                DATE: 03/19/24 Therapeutic Exercise: UBE level 1 2' fwd/bwd while gathering subjective and planning session with patient Pball x 10 flexion, x 10 L diagonal  Seated TS/LS extension with soft foam roller 2 x 10, with cervical extension Finger Ladder flexion x 6  Finger Ladder abduction x 6  L shoulder adduction isometric, pillow squeeze, 3 sec x 10  Seated flexion 2lb x 8 Seated scaptions 2lb x 6   [facility] Adult PT Treatment:                                                DATE: 03/13/24  Therapeutic Exercise: Pball x 8 flexion, x 8 L diagonal  Seated TS/LS extension with soft foam roller 2 x 10, with cervical extension   Self-Care:  Patient education regarding conservative pain management and stress management strategies to improve pain severity and nervous system regulation; includes progressive mm relaxation, breathing exercises, low-intensity walk (10-15 minutes), other existing strategies for decompression following stressful  work day.     PATIENT EDUCATION: Education details:  reviewed initial home exercise program; discussion of POC, prognosis and goals for skilled PT   Person educated: Patient Education method: Explanation, Demonstration, and Handouts Education comprehension: verbalized understanding, returned demonstration, and needs further education  HOME EXERCISE PROGRAM: Access Code: [email or web address] URL: [email or web address] Date: 03/13/2024 Prepared by: Marko Molt  Exercises - Isometric Shoulder Flexion at Wall  - 1 x daily - 7 x weekly - 2 sets - 10 reps - 3-5 sec hold - Standing Isometric Shoulder Internal Rotation at Doorway  - 1 x daily - 7 x weekly - 2 sets - 10 reps - 3-5 sec hold - Isometric Shoulder Extension at Wall  - 1 x daily - 7 x weekly - 2 sets - 10 reps - 3-5 sec hold - Standing Isometric Shoulder External Rotation with Doorway  - 1 x daily - 7 x weekly - 2 sets - 10 reps - 3-5 sec hold - 15 Minute Progressive Muscle Relaxation for Pain Relief   ASSESSMENT:  CLINICAL IMPRESSION: 04/02/24 Patient has improve pain severity today and improve exercises tolerance. However, she continues to be most limited with lifting activities. Plan at this time is increased focus on progression of stabilization exercises.    EVAL: Bellany is a 23 y.o. female who was seen today for physical therapy evaluation and treatment for L shoulder pain that began after a heavy item fell onto her L shoulder while at work. She is demonstrating ful L shouler AROM, decreased L shoulder strength, and decreased postural mm endurance. She has related pain and difficulty with heavy lifting, overhead reaching, overhead lifting, prolonged carrying of tablet at work, along with difficulty with any activities that requires force or impact through her arm. She is limited with her work related activities. She requires skilled PT services at this time to address relevant deficits and improve overall function.      OBJECTIVE IMPAIRMENTS: decreased endurance, decreased strength, impaired UE functional use, improper body mechanics, postural dysfunction, and pain.   ACTIVITY LIMITATIONS: carrying, lifting, sleeping, and reach over head  PARTICIPATION LIMITATIONS: meal prep, cleaning, and occupation  PERSONAL FACTORS: Past/current experiences and Time since onset of injury/illness/exacerbation are also affecting patient's functional outcome.   REHAB POTENTIAL: Good  CLINICAL DECISION MAKING: Stable/uncomplicated  EVALUATION COMPLEXITY: Low   GOALS: Goals reviewed with patient? YES  SHORT TERM GOALS: Target date: 03/06/2024   Patient will be independent with initial home program at least 3 days/week.  Baseline: provided at eval Goal Status: MET  2.  Patient will demonstrate improved postural awareness for at least 15 minutes while seated without need for cueing from PT.  Baseline: see objective measures Goal Status: MET  3.  Patient will demonstrate improved shoulder abduction strength to 4+/5 MMT or greater. Baseline: 4-/5 03/26/2024: see MMT chart above  Goal status: PROGRESSING   LONG TERM GOALS: Target date: 04/24/2024    Patient will report improved overall functional ability with Quick Dash score of 14 or less.  Baseline: 24 03/26/2024:  29.5 Goal Status: ONGOING   2.  Patient will demonstrate ability to perform floor to waist lifting of at least 40# using appropriate body mechanics and with no more than minimal pain in order to safely perform normal daily/occupational tasks.   Baseline: occasionally lifting about 24lb of lemonade with difficulty   03/26/2024: able to lift 20 lb from waist to shoulder height (more specific to work activity), but requires cueing for improved lifting mechanics   Goal Status: PROGRESSING; goal addended to be specific to work  related goal of lifting 40# container of ice cream    3.  Patient will demonstrate ability to perform overhead lifting of  at least 10# using appropriate body mechanics and with no more than minimal pain in order to safely perform normal daily/occupational tasks.   Baseline: pain and difficulty   03/26/2024: able to perform BIL lifting up to 10# with minimal pain  Goal Status: PROGRESSING; goal addended to be specific to work related goal of lifting 20# container of lemonade/tea    4.  Patient will report 3/10 pain at worst with daily activities, including light-to-moderate household chores.  Baseline: 6-8/10  03/26/2024 6/10  Goal status:ONGOING     PLAN:  PT FREQUENCY: 1-2x/week  PT DURATION: 4 weeks  PLANNED INTERVENTIONS: 97164- PT Re-evaluation, 97750- Physical Performance Testing, 97110-Therapeutic exercises, 97530- Therapeutic activity, 97112- Neuromuscular re-education, 97535- Self Care, [id number]- Manual therapy, [id number]- Electrical stimulation (unattended), [phone]- Ionotophoresis 4mg /ml Dexamethasone, Patient/Family education, Taping, Joint mobilization, Cryotherapy, and Moist heat  PLAN FOR NEXT SESSION: progress functional strengthening, lifting, and repetitive reaching activities in order to return to PLOF related to work-related tasks.    Marko Molt, PT, DPT  04/05/2024 6:45 AM

## 2024-04-09 ENCOUNTER — Ambulatory Visit

## 2024-04-09 NOTE — Therapy (Incomplete)
 OUTPATIENT PHYSICAL THERAPY TREATMENT NOTE   Patient Name: Denise Hendrix MRN: [id number] DOB:07/15/2001, 23 y.o., female Today's Date: 04/09/2024  END OF SESSION:       No past medical history on file. No past surgical history on file. There are no active problems to display for this patient.   PCP: Dow Ozell PARAS, PA  REFERRING PROVIDER:  Shirly Carlin CROME, PA-C   REFERRING DIAG:  [id number] (ICD-10-CM) - Acute pain of left shoulder  Left shoulder PT 1-2x/week x 6 weeks Focus on strengthening exercises, symptomatic labral pathology  THERAPY DIAG:  No diagnosis found.  Rationale for Evaluation and Treatment: Rehabilitation  ONSET DATE: 11/19/2023   SUBJECTIVE:                                                                                                                                                                                      SUBJECTIVE STATEMENT: ***  EVAL: Patient reports to PT with L shoulder pain that has been present since something fell onto the back of L shoulder. She has trouble holding tablet for extended periods of time while working at [employer]. She states that her arm will give out on her, and that weakness is a bigger problem for her than pain. She would like to be able to fulfill all her work requirements including lifting 35lbs. Still lifting 3 gallons of lemonade; not lifting boxes of ice cream 45-48lb.   Hand dominance: Right   PERTINENT HISTORY: No relevant PMHx   PAIN:  Are you having pain? Yes: NPRS scale: 6/10 current, 8/10 worst Pain location: posterior L shoulder Pain description: heavy, tense, pinching (with lifting)  Aggravating factors: lifting, OH reaching lifting, sporadic movements  Relieving factors: Voltaren gel, aleve , tylenol     PRECAUTIONS: None  RED FLAGS: None   WEIGHT BEARING RESTRICTIONS: No  FALLS:  Has patient fallen in last 6 months? No  LIVING ENVIRONMENT: Lives with: lives with their  family  OCCUPATION: Team Member/Front of Technical brewer at [employer]   PLOF: Independent  PATIENT GOALS: Patient would like to have the clicking and popping decreasing; and be able to lift at least 35lb  NEXT MD VISIT:   OBJECTIVE:  Note: Objective measures were completed at Evaluation unless otherwise noted.  DIAGNOSTIC FINDINGS:  11/30/2023 L shoulder XRAY  AP, Scap Y, axillary views of left shoulder reviewed.  No fracture or  dislocation.  No abnormality noted to the glenohumeral or  acromioclavicular joints.  No abnormal acromiohumeral interval   PATIENT SURVEYS:  Quick Dash:  QUICK DASH  Please rate your ability do the following activities in the last week by selecting the number below the appropriate response.  Activities Rating 03/26/2024  Open a tight or new jar.  3 = Moderate difficulty 3  Do heavy household chores (e.g., wash walls, floors). 3 = Moderate difficulty 3  Carry a shopping bag or briefcase 2 = Mild difficulty 3  Wash your back. 1 = No difficulty  1  Use a knife to cut food. 1 = No difficulty  1  Recreational activities in which you take some force or impact through your arm, shoulder or hand (e.g., golf, hammering, tennis, etc.). 3 = Moderate difficulty 3  During the past week, to what extent has your arm, shoulder or hand problem interfered with your normal social activities with family, friends, neighbors or groups?  3 = Moderately 2  During the past week, were you limited in your work or other regular daily activities as a result of your arm, shoulder or hand problem? 3 = Moderately limited 2  Rate the severity of the following symptoms in the last week: Arm, Shoulder, or hand pain. 3 = Moderate 3  Rate the severity of the following symptoms in the last week: Tingling (pins and needles) in your arm, shoulder or hand. 1 = none 1  During the past week, how much difficulty have you had sleeping because of the pain in your arm, shoulder or hand?  2 = Mild  difficulty 2   (A QuickDASH score may not be calculated if there is greater than 1 missing item.)  Quick Dash Disability/Symptom Score: 24  04/09/2024 29.5  Minimally Clinically Important Difference (MCID): 15-20 points  Flavio, F. et al. (2013). Minimally clinically important difference of the disabilities of the arm, shoulder, and hand outcome measures (DASH) and its shortened version (Quick DASH). Journal of Orthopaedic & Sports Physical Therapy, 44(1), 30-39)   COGNITION: Overall cognitive status: Within functional limits for tasks assessed     SENSATION: Not tested  POSTURE: Mild shoulder rounding, mild forward  UPPER EXTREMITY ROM: WFL BIL, pain with end range L shoulder abduction noted as pinching   UPPER EXTREMITY MMT:   Active MMT Right eval Left eval Right 03/26/2024 Left 03/26/2024   Shoulder flexion 5 4+ 5 4+  Shoulder extension      Shoulder abduction 4+ 4- 4+ 4  Shoulder adduction      Shoulder internal rotation 4+ 4+ 4 4+  Shoulder external rotation 4 4+ 4 4+  Elbow flexion      Elbow extension      Wrist flexion      Wrist extension      Wrist ulnar deviation      Wrist radial deviation      Wrist pronation      Wrist supination      Grip strength  46lb 34lb  45lb  40lb   (Blank rows = not tested)   SHOULDER SPECIAL TESTS: Impingement tests: deferred Rotator cuff assessment: Gerber lift off test: negative  TREATMENT DATE:   [facility] Adult PT Treatment:                                                DATE: 04/09/2024   Therapeutic Activity:  UBE level 1 2' fwd/bwd while gathering subjective and planning session with patient Pball x 10 flexion, x 10 L diagonal  Finger Ladder flexion x 6  Finger Ladder abduction x 6  Wall plank 2 x 20 sec Wall plank on small pball 2 x 20 sec with mild perturbations   [facility] Adult PT  Treatment:                                                DATE: 04/02/2024   Therapeutic Activity:  UBE level 1 2' fwd/bwd while gathering subjective and planning session with patient Pball x 10 flexion, x 10 L diagonal  Finger Ladder flexion x 6  Finger Ladder abduction x 6  Wall plank 2 x 20 sec Wall plank on small pball 2 x 20 sec with mild perturbations   [facility] Adult PT Treatment:                                                DATE: 03/26/2024  Therapeutic Activity:  Reassessment of objective measures and subjective assessment regarding progress towards established goals and updated plan for addressing remaining deficits and rehab goals.    [facility] Adult PT Treatment:                                                DATE: 03/19/24 Therapeutic Exercise: UBE level 1 2' fwd/bwd while gathering subjective and planning session with patient Pball x 10 flexion, x 10 L diagonal  Seated TS/LS extension with soft foam roller 2 x 10, with cervical extension Finger Ladder flexion x 6  Finger Ladder abduction x 6  L shoulder adduction isometric, pillow squeeze, 3 sec x 10  Seated flexion 2lb x 8 Seated scaptions 2lb x 6     PATIENT EDUCATION: Education details: reviewed initial home exercise program; discussion of POC, prognosis and goals for skilled PT   Person educated: Patient Education method: Explanation, Demonstration, and Handouts Education comprehension: verbalized understanding, returned demonstration, and needs further education  HOME EXERCISE PROGRAM: Access Code: [email or web address] URL: [email or web address] Date: 03/13/2024 Prepared by: Marko Molt  Exercises - Isometric Shoulder Flexion at Wall  - 1 x daily - 7 x weekly - 2 sets - 10 reps - 3-5 sec hold - Standing Isometric Shoulder Internal Rotation at Doorway  - 1 x daily - 7 x weekly - 2 sets - 10 reps - 3-5 sec hold - Isometric Shoulder Extension at Wall  - 1 x daily - 7 x weekly - 2 sets - 10 reps - 3-5 sec hold -  Standing Isometric Shoulder External Rotation with Doorway  - 1 x daily - 7 x weekly - 2 sets - 10 reps - 3-5 sec hold -  15 Minute Progressive Muscle Relaxation for Pain Relief   ASSESSMENT:  CLINICAL IMPRESSION: 04/09/24 ***   EVAL: Latangela is a 23 y.o. female who was seen today for physical therapy evaluation and treatment for L shoulder pain that began after a heavy item fell onto her L shoulder while at work. She is demonstrating ful L shouler AROM, decreased L shoulder strength, and decreased postural mm endurance. She has related pain and difficulty with heavy lifting, overhead reaching, overhead lifting, prolonged carrying of tablet at work, along with difficulty with any activities that requires force or impact through her arm. She is limited with her work related activities. She requires skilled PT services at this time to address relevant deficits and improve overall function.     OBJECTIVE IMPAIRMENTS: decreased endurance, decreased strength, impaired UE functional use, improper body mechanics, postural dysfunction, and pain.   ACTIVITY LIMITATIONS: carrying, lifting, sleeping, and reach over head  PARTICIPATION LIMITATIONS: meal prep, cleaning, and occupation  PERSONAL FACTORS: Past/current experiences and Time since onset of injury/illness/exacerbation are also affecting patient's functional outcome.   REHAB POTENTIAL: Good  CLINICAL DECISION MAKING: Stable/uncomplicated  EVALUATION COMPLEXITY: Low   GOALS: Goals reviewed with patient? YES  SHORT TERM GOALS: Target date: 03/06/2024   Patient will be independent with initial home program at least 3 days/week.  Baseline: provided at eval Goal Status: MET  2.  Patient will demonstrate improved postural awareness for at least 15 minutes while seated without need for cueing from PT.  Baseline: see objective measures Goal Status: MET  3.  Patient will demonstrate improved shoulder abduction strength to 4+/5 MMT or  greater. Baseline: 4-/5 03/26/2024: see MMT chart above  Goal status: PROGRESSING   LONG TERM GOALS: Target date: 04/24/2024    Patient will report improved overall functional ability with Quick Dash score of 14 or less.  Baseline: 24 03/26/2024:  29.5 Goal Status: ONGOING   2.  Patient will demonstrate ability to perform floor to waist lifting of at least 40# using appropriate body mechanics and with no more than minimal pain in order to safely perform normal daily/occupational tasks.   Baseline: occasionally lifting about 24lb of lemonade with difficulty   03/26/2024: able to lift 20 lb from waist to shoulder height (more specific to work activity), but requires cueing for improved lifting mechanics   Goal Status: PROGRESSING; goal addended to be specific to work related goal of lifting 40# container of ice cream    3.  Patient will demonstrate ability to perform overhead lifting of at least 10# using appropriate body mechanics and with no more than minimal pain in order to safely perform normal daily/occupational tasks.   Baseline: pain and difficulty   03/26/2024: able to perform BIL lifting up to 10# with minimal pain  Goal Status: PROGRESSING; goal addended to be specific to work related goal of lifting 20# container of lemonade/tea    4.  Patient will report 3/10 pain at worst with daily activities, including light-to-moderate household chores.  Baseline: 6-8/10  03/26/2024 6/10  Goal status:ONGOING     PLAN:  PT FREQUENCY: 1-2x/week  PT DURATION: 4 weeks  PLANNED INTERVENTIONS: 97164- PT Re-evaluation, 97750- Physical Performance Testing, 97110-Therapeutic exercises, 97530- Therapeutic activity, 97112- Neuromuscular re-education, 97535- Self Care, [id number]- Manual therapy, [id number]- Electrical stimulation (unattended), [phone]- Ionotophoresis 4mg /ml Dexamethasone, Patient/Family education, Taping, Joint mobilization, Cryotherapy, and Moist heat  PLAN FOR NEXT SESSION: progress  functional strengthening, lifting, and repetitive reaching activities in order to return to PLOF related to  work-related tasks.    Marko Molt, PT, DPT  04/09/2024 3:25 PM

## 2024-04-23 ENCOUNTER — Ambulatory Visit: Attending: Surgical

## 2024-04-23 DIAGNOSIS — M25512 Pain in left shoulder: Secondary | ICD-10-CM | POA: Insufficient documentation

## 2024-04-23 DIAGNOSIS — M25612 Stiffness of left shoulder, not elsewhere classified: Secondary | ICD-10-CM | POA: Insufficient documentation

## 2024-04-23 DIAGNOSIS — M6281 Muscle weakness (generalized): Secondary | ICD-10-CM | POA: Insufficient documentation

## 2024-04-23 NOTE — Therapy (Signed)
 OUTPATIENT PHYSICAL THERAPY TREATMENT NOTE   Patient Name: Arcola Freshour MRN: [id number] DOB:01-02-2001, 23 y.o., female Today's Date: 04/23/2024  END OF SESSION:   PT End of Session - 04/23/24 1536     Visit Number 7    Number of Visits 12    Date for Recertification  04/24/24    Authorization Type [employer]    PT Start Time 1535    PT Stop Time 1613    PT Time Calculation (min) 38 min    Activity Tolerance Patient limited by pain;Patient tolerated treatment well    Behavior During Therapy [facility] for tasks assessed/performed           No past medical history on file. No past surgical history on file. There are no active problems to display for this patient.   PCP: Dow Ozell PARAS, PA  REFERRING PROVIDER:  Shirly Carlin CROME, PA-C   REFERRING DIAG:  [phone] (ICD-10-CM) - Acute pain of left shoulder  Left shoulder PT 1-2x/week x 6 weeks Focus on strengthening exercises, symptomatic labral pathology  THERAPY DIAG:  Left shoulder pain, unspecified chronicity  Stiffness of left shoulder, not elsewhere classified  Muscle weakness (generalized)  Rationale for Evaluation and Treatment: Rehabilitation  ONSET DATE: 11/19/2023   SUBJECTIVE:                                                                                                                                                                                      SUBJECTIVE STATEMENT: Patient reporting less pain today, reporting that she is managing better at work. However, she is limiting her lifting whenever her pain is present.   EVAL: Patient reports to PT with L shoulder pain that has been present since something fell onto the back of L shoulder. She has trouble holding tablet for extended periods of time while working at [employer]. She states that her arm will give out on her, and that weakness is a bigger problem for her than pain. She would like to be able to fulfill all her work requirements  including lifting 35lbs. Still lifting 3 gallons of lemonade; not lifting boxes of ice cream 45-48lb.   Hand dominance: Right   PERTINENT HISTORY: No relevant PMHx   PAIN:  Are you having pain? Yes: NPRS scale: 6/10 current, 8/10 worst Pain location: posterior L shoulder Pain description: heavy, tense, pinching (with lifting)  Aggravating factors: lifting, OH reaching lifting, sporadic movements  Relieving factors: Voltaren gel, aleve , tylenol     PRECAUTIONS: None  RED FLAGS: None   WEIGHT BEARING RESTRICTIONS: No  FALLS:  Has patient fallen in last 6 months? No  LIVING ENVIRONMENT: Lives with: lives with  their family  OCCUPATION: Team Member/Front of Technical brewer at [employer]   PLOF: Independent  PATIENT GOALS: Patient would like to have the clicking and popping decreasing; and be able to lift at least 35lb  NEXT MD VISIT:   OBJECTIVE:  Note: Objective measures were completed at Evaluation unless otherwise noted.  DIAGNOSTIC FINDINGS:  11/30/2023 L shoulder XRAY  AP, Scap Y, axillary views of left shoulder reviewed.  No fracture or  dislocation.  No abnormality noted to the glenohumeral or  acromioclavicular joints.  No abnormal acromiohumeral interval   PATIENT SURVEYS:  Quick Dash:  QUICK DASH  Please rate your ability do the following activities in the last week by selecting the number below the appropriate response.   Activities Rating 03/26/2024  Open a tight or new jar.  3 = Moderate difficulty 3  Do heavy household chores (e.g., wash walls, floors). 3 = Moderate difficulty 3  Carry a shopping bag or briefcase 2 = Mild difficulty 3  Wash your back. 1 = No difficulty  1  Use a knife to cut food. 1 = No difficulty  1  Recreational activities in which you take some force or impact through your arm, shoulder or hand (e.g., golf, hammering, tennis, etc.). 3 = Moderate difficulty 3  During the past week, to what extent has your arm, shoulder or hand  problem interfered with your normal social activities with family, friends, neighbors or groups?  3 = Moderately 2  During the past week, were you limited in your work or other regular daily activities as a result of your arm, shoulder or hand problem? 3 = Moderately limited 2  Rate the severity of the following symptoms in the last week: Arm, Shoulder, or hand pain. 3 = Moderate 3  Rate the severity of the following symptoms in the last week: Tingling (pins and needles) in your arm, shoulder or hand. 1 = none 1  During the past week, how much difficulty have you had sleeping because of the pain in your arm, shoulder or hand?  2 = Mild difficulty 2   (A QuickDASH score may not be calculated if there is greater than 1 missing item.)  Quick Dash Disability/Symptom Score: 24  04/23/2024 29.5  Minimally Clinically Important Difference (MCID): 15-20 points  Flavio, F. et al. (2013). Minimally clinically important difference of the disabilities of the arm, shoulder, and hand outcome measures (DASH) and its shortened version (Quick DASH). Journal of Orthopaedic & Sports Physical Therapy, 44(1), 30-39)   COGNITION: Overall cognitive status: Within functional limits for tasks assessed     SENSATION: Not tested  POSTURE: Mild shoulder rounding, mild forward  UPPER EXTREMITY ROM: WFL BIL, pain with end range L shoulder abduction noted as pinching   UPPER EXTREMITY MMT:   Active MMT Right eval Left eval Right 03/26/2024 Left 03/26/2024   Shoulder flexion 5 4+ 5 4+  Shoulder extension      Shoulder abduction 4+ 4- 4+ 4  Shoulder adduction      Shoulder internal rotation 4+ 4+ 4 4+  Shoulder external rotation 4 4+ 4 4+  Elbow flexion      Elbow extension      Wrist flexion      Wrist extension      Wrist ulnar deviation      Wrist radial deviation      Wrist pronation      Wrist supination      Grip strength  46lb 34lb  45lb  40lb   (Blank rows = not tested)   SHOULDER  SPECIAL TESTS: Impingement tests: deferred Rotator cuff assessment: Gerber lift off test: negative                                                                                                                             TREATMENT DATE:   [facility] Adult PT Treatment:                                                DATE: 04/23/2024  Therapeutic Activity:  UBE level 1 2' fwd/bwd while gathering subjective and planning session with patient Pball x 10 flexion, x 10 L diagonal  Finger Ladder flexion x 6  Finger Ladder abduction x 6  Wall plank on small pball 2 x 20 sec with mild perturbations  Body Blade 2 x 30 sec each 0 deg flexion  90 deg flexion, punch  90 deg abduction, punch   [facility] Adult PT Treatment:                                                DATE: 04/02/2024   Therapeutic Activity:  UBE level 1 2' fwd/bwd while gathering subjective and planning session with patient Pball x 10 flexion, x 10 L diagonal  Finger Ladder flexion x 6  Finger Ladder abduction x 6  Wall plank 2 x 20 sec Wall plank on small pball 2 x 20 sec with mild perturbations   [facility] Adult PT Treatment:                                                DATE: 03/26/2024  Therapeutic Activity:  Reassessment of objective measures and subjective assessment regarding progress towards established goals and updated plan for addressing remaining deficits and rehab goals.    [facility] Adult PT Treatment:                                                DATE: 03/19/24 Therapeutic Exercise: UBE level 1 2' fwd/bwd while gathering subjective and planning session with patient Pball x 10 flexion, x 10 L diagonal  Seated TS/LS extension with soft foam roller 2 x 10, with cervical extension Finger Ladder flexion x 6  Finger Ladder abduction x 6  L shoulder adduction isometric, pillow squeeze, 3 sec x 10  Seated flexion 2lb x 8 Seated scaptions 2lb x 6  PATIENT EDUCATION: Education details: reviewed initial home exercise program;  discussion of POC, prognosis and goals for skilled PT   Person educated: Patient Education method: Explanation, Demonstration, and Handouts Education comprehension: verbalized understanding, returned demonstration, and needs further education  HOME EXERCISE PROGRAM: Access Code: [email or web address] URL: [email or web address] Date: 03/13/2024 Prepared by: Marko Molt  Exercises - Isometric Shoulder Flexion at Wall  - 1 x daily - 7 x weekly - 2 sets - 10 reps - 3-5 sec hold - Standing Isometric Shoulder Internal Rotation at Doorway  - 1 x daily - 7 x weekly - 2 sets - 10 reps - 3-5 sec hold - Isometric Shoulder Extension at Wall  - 1 x daily - 7 x weekly - 2 sets - 10 reps - 3-5 sec hold - Standing Isometric Shoulder External Rotation with Doorway  - 1 x daily - 7 x weekly - 2 sets - 10 reps - 3-5 sec hold - 15 Minute Progressive Muscle Relaxation for Pain Relief   ASSESSMENT:  CLINICAL IMPRESSION: 04/23/24 Mersedes had improved tolerance of today's treatment session, which focused on increased stabilization activities. She will benefit from focus on dynamic stabilization at >90 degrees of shoulder elevation. We will continue to progress per POC as tolerated, in order to reach established rehab goals.     EVAL: Wynee is a 23 y.o. female who was seen today for physical therapy evaluation and treatment for L shoulder pain that began after a heavy item fell onto her L shoulder while at work. She is demonstrating ful L shouler AROM, decreased L shoulder strength, and decreased postural mm endurance. She has related pain and difficulty with heavy lifting, overhead reaching, overhead lifting, prolonged carrying of tablet at work, along with difficulty with any activities that requires force or impact through her arm. She is limited with her work related activities. She requires skilled PT services at this time to address relevant deficits and improve overall function.     OBJECTIVE  IMPAIRMENTS: decreased endurance, decreased strength, impaired UE functional use, improper body mechanics, postural dysfunction, and pain.   ACTIVITY LIMITATIONS: carrying, lifting, sleeping, and reach over head  PARTICIPATION LIMITATIONS: meal prep, cleaning, and occupation  PERSONAL FACTORS: Past/current experiences and Time since onset of injury/illness/exacerbation are also affecting patient's functional outcome.   REHAB POTENTIAL: Good  CLINICAL DECISION MAKING: Stable/uncomplicated  EVALUATION COMPLEXITY: Low   GOALS: Goals reviewed with patient? YES  SHORT TERM GOALS: Target date: 03/06/2024   Patient will be independent with initial home program at least 3 days/week.  Baseline: provided at eval Goal Status: MET  2.  Patient will demonstrate improved postural awareness for at least 15 minutes while seated without need for cueing from PT.  Baseline: see objective measures Goal Status: MET  3.  Patient will demonstrate improved shoulder abduction strength to 4+/5 MMT or greater. Baseline: 4-/5 03/26/2024: see MMT chart above  Goal status: PROGRESSING   LONG TERM GOALS: Target date: 04/24/2024    Patient will report improved overall functional ability with Quick Dash score of 14 or less.  Baseline: 24 03/26/2024:  29.5 Goal Status: ONGOING   2.  Patient will demonstrate ability to perform floor to waist lifting of at least 40# using appropriate body mechanics and with no more than minimal pain in order to safely perform normal daily/occupational tasks.   Baseline: occasionally lifting about 24lb of lemonade with difficulty   03/26/2024: able to lift 20 lb from waist to shoulder height (more specific to work activity), but  requires cueing for improved lifting mechanics   Goal Status: PROGRESSING; goal addended to be specific to work related goal of lifting 40# container of ice cream    3.  Patient will demonstrate ability to perform overhead lifting of at least 10#  using appropriate body mechanics and with no more than minimal pain in order to safely perform normal daily/occupational tasks.   Baseline: pain and difficulty   03/26/2024: able to perform BIL lifting up to 10# with minimal pain  Goal Status: PROGRESSING; goal addended to be specific to work related goal of lifting 20# container of lemonade/tea    4.  Patient will report 3/10 pain at worst with daily activities, including light-to-moderate household chores.  Baseline: 6-8/10  03/26/2024 6/10  Goal status:ONGOING     PLAN:  PT FREQUENCY: 1-2x/week  PT DURATION: 4 weeks  PLANNED INTERVENTIONS: 97164- PT Re-evaluation, 97750- Physical Performance Testing, 97110-Therapeutic exercises, 97530- Therapeutic activity, 97112- Neuromuscular re-education, 97535- Self Care, 02859- Manual therapy, G0283- Electrical stimulation (unattended), [phone]- Ionotophoresis 4mg /ml Dexamethasone, Patient/Family education, Taping, Joint mobilization, Cryotherapy, and Moist heat  PLAN FOR NEXT SESSION: progress functional strengthening, lifting, and repetitive reaching activities in order to return to PLOF related to work-related tasks.    Marko Molt, PT, DPT  04/24/2024 6:59 AM

## 2024-04-28 ENCOUNTER — Ambulatory Visit (INDEPENDENT_AMBULATORY_CARE_PROVIDER_SITE_OTHER): Admitting: Surgical

## 2024-04-28 ENCOUNTER — Encounter: Payer: Self-pay | Admitting: Surgical

## 2024-04-28 DIAGNOSIS — M25512 Pain in left shoulder: Secondary | ICD-10-CM

## 2024-04-28 DIAGNOSIS — S43432D Superior glenoid labrum lesion of left shoulder, subsequent encounter: Secondary | ICD-10-CM | POA: Diagnosis not present

## 2024-04-30 ENCOUNTER — Ambulatory Visit

## 2024-05-04 ENCOUNTER — Encounter: Payer: Self-pay | Admitting: Surgical

## 2024-05-04 NOTE — Progress Notes (Signed)
 Follow-up Office Visit Note   Patient: Denise Hendrix           Date of Birth: 05-05-2001           MRN: [id number] Visit Date: 04/28/2024 Requested by: No referring provider defined for this encounter. PCP: Patient, No Pcp Per  Subjective: Chief Complaint  Patient presents with   Left Shoulder - Follow-up    DOI 11/19/2023    HPI: Denise Hendrix is a 23 y.o. female who returns to the office for follow-up visit.    Plan at last visit was: Denise Hendrix is a 23 y.o. female who returns to the office for follow-up visit.  Plan from last visit was noted above in HPI.  They now return with continued symptoms.  Has only had 2 sessions of PT.  Planning to do more in September.  Think it would be best to give her more chance of doing therapy and we will see her back in 6 weeks.  She is having a lot of of changes in her personal life that are somewhat interfering with her optimal shoulder rehab which is understandable.  She will return in 6 weeks and if no improvement at that time after more extensive PT, would consider either a left glenohumeral injection versus repeat MRI arthrogram.  She has a lot of anxiety about MRI scans.   Since then, patient notes she has been going to physical therapy more consistently about 1 time per week.  She feels that this has helped significantly with the posterior lateral deltoid region pain that she was having and she is having less anterior pain as well.  More the pain she has nowadays is localizing around the [facility] joint with some radiation across to the mid clavicle shaft region which is similar to [facility] joint pain that she has had on and off in the past.  She is having less clicking and crepitus noted around the posterior aspect of the shoulder but is having some clicking on the superior aspect of the shoulder.  Not taking any pain medication.  Overall she feels better than she did at her last appointment about 6 weeks ago.              ROS: All systems  reviewed are negative as they relate to the chief complaint within the history of present illness.  Patient denies fevers or chills.  Assessment & Plan: Visit Diagnoses:  1. Arthralgia of left acromioclavicular joint   2. Labral tear of shoulder, left, subsequent encounter     Plan: Denise Hendrix is a 23 y.o. female who returns to the office for follow-up visit.  Plan from last visit was noted above in HPI.  They now return with improvement with consistent physical therapy which is definitely making her shoulder feel better compared with about 6 weeks ago.  At this point, she seems to be having more symptoms from her [facility] joint rather than the labral tear she has had caused her symptoms in the past.  She would prefer to avoid any sort of injection or repeat evaluation with MRI arthrogram and prefers to continue with physical therapy and eventually transition to home exercise program.  As such, we will release her for now but she is welcome to come back if her pain worsens to the point where she would consider injection or surgical intervention or would just like to have reevaluation.  Patient agreed with plan.  Follow-up as needed.  Follow-Up Instructions: No follow-ups on file.  Orders:  No orders of the defined types were placed in this encounter.  No orders of the defined types were placed in this encounter.     Procedures: No procedures performed   Clinical Data: No additional findings.  Objective: Vital Signs: There were no vitals taken for this visit.  Physical Exam:  Constitutional: Patient appears well-developed HEENT:  Head: Normocephalic Eyes:EOM are normal Neck: Normal range of motion Cardiovascular: Normal rate Pulmonary/chest: Effort normal Neurologic: Patient is alert Skin: Skin is warm Psychiatric: Patient has normal mood and affect  Ortho Exam: Ortho exam demonstrates no crepitus noted with passive motion of the left shoulder in the region of the small  posterior labral tear that she had identified on previous MRI scan.  She does have some mild clicking in the region of the [facility] joint with mild to moderate tenderness over this area as well.  No pain reproduced with crossarm adduction.  She has minimal tenderness over the bicipital groove.  Intact EPL, FPL, finger abduction, pronation/supination, bicep, tricep, deltoid.  Range of motion of the left shoulder both actively and passively are comparable to the contralateral shoulder without any asymmetric stiffness.  She has excellent rotator cuff strength of the left shoulder rated 5/5 of supra, infra, subscap.  Specialty Comments:  No specialty comments available.  Imaging: No results found.   PMFS History: There are no active problems to display for this patient.  History reviewed. No pertinent past medical history.  No family history on file.  History reviewed. No pertinent surgical history. Social History   Occupational History   Not on file  Tobacco Use   Smoking status: Every Day    Types: E-cigarettes   Smokeless tobacco: Not on file  Vaping Use   Vaping status: Some Days   Substances: Nicotine, Flavoring  Substance and Sexual Activity   Alcohol use: Never   Drug use: Never   Sexual activity: Not on file

## 2024-05-14 ENCOUNTER — Ambulatory Visit

## 2024-05-14 DIAGNOSIS — M25512 Pain in left shoulder: Secondary | ICD-10-CM | POA: Diagnosis not present

## 2024-05-14 DIAGNOSIS — M6281 Muscle weakness (generalized): Secondary | ICD-10-CM

## 2024-05-14 DIAGNOSIS — M25612 Stiffness of left shoulder, not elsewhere classified: Secondary | ICD-10-CM

## 2024-05-14 NOTE — Therapy (Signed)
 OUTPATIENT PHYSICAL THERAPY NOTE RECERTIFICATION   Patient Name: Denise Hendrix MRN: [id number] DOB:2001/04/12, 23 y.o., female Today's Date: 05/14/2024  END OF SESSION:   PT End of Session - 05/14/24 1537       Visit Number 8    Number of Visits 12     Date for Recertification  06/29/2024      Authorization Type [employer]     PT Start Time 1535     PT Stop Time 1613     PT Time Calculation (min) 38 min     Activity Tolerance Patient limited by pain;Patient tolerated treatment well     Behavior During Therapy [facility] for tasks assessed/performed       No past medical history on file. No past surgical history on file. There are no active problems to display for this patient.   PCP: Dow Ozell PARAS, PA  REFERRING PROVIDER:  Shirly Carlin CROME, PA-C   REFERRING DIAG:  [phone] (ICD-10-CM) - Acute pain of left shoulder  Left shoulder PT 1-2x/week x 6 weeks Focus on strengthening exercises, symptomatic labral pathology  THERAPY DIAG:  Left shoulder pain, unspecified chronicity  Stiffness of left shoulder, not elsewhere classified  Muscle weakness (generalized)  Rationale for Evaluation and Treatment: Rehabilitation  ONSET DATE: 11/19/2023   SUBJECTIVE:                                                                                                                                                                                      SUBJECTIVE STATEMENT: Patient reports that her primary challenge at this time is performing heavy lifting at work. She has begun asking her coworkers to lift again d/t pain.   EVAL: Patient reports to PT with L shoulder pain that has been present since something fell onto the back of L shoulder. She has trouble holding tablet for extended periods of time while working at [employer]. She states that her arm will give out on her, and that weakness is a bigger problem for her than pain. She would like to be able to fulfill all her  work requirements including lifting 35lbs. Still lifting 3 gallons of lemonade; not lifting boxes of ice cream 45-48lb.   Hand dominance: Right   PERTINENT HISTORY: No relevant PMHx   PAIN:  Are you having pain? Yes: NPRS scale: 6/10 current, 8/10 worst Pain location: posterior L shoulder Pain description: heavy, tense, pinching (with lifting)  Aggravating factors: lifting, OH reaching lifting, sporadic movements  Relieving factors: Voltaren gel, aleve , tylenol     PRECAUTIONS: None  RED FLAGS: None   WEIGHT BEARING RESTRICTIONS: No  FALLS:  Has patient fallen in last 6 months?  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  DIAGNOSTIC FINDINGS:  11/30/2023 L shoulder XRAY  AP, Scap Y, axillary views of left shoulder reviewed.  No fracture or  dislocation.  No abnormality noted to the glenohumeral or  acromioclavicular joints.  No abnormal acromiohumeral interval   PATIENT SURVEYS:  Quick Dash:  QUICK DASH  Please rate your ability do the following activities in the last week by selecting the number below the appropriate response.   Activities Rating 03/26/2024 05/14/2024   Open a tight or new jar.  3 = Moderate difficulty 3 2  Do heavy household chores (e.g., wash walls, floors). 3 = Moderate difficulty 3 1  Carry a shopping bag or briefcase 2 = Mild difficulty 3 1  Wash your back. 1 = No difficulty  1 1  Use a knife to cut food. 1 = No difficulty  1 1  Recreational activities in which you take some force or impact through your arm, shoulder or hand (e.g., golf, hammering, tennis, etc.). 3 = Moderate difficulty 3 2  During the past week, to  what extent has your arm, shoulder or hand problem interfered with your normal social activities with family, friends, neighbors or groups?  3 = Moderately 2 1  During the past week, were you limited in your work or other regular daily activities as a result of your arm, shoulder or hand problem? 3 = Moderately limited 2 2  Rate the severity of the following symptoms in the last week: Arm, Shoulder, or hand pain. 3 = Moderate 3 2  Rate the severity of the following symptoms in the last week: Tingling (pins and needles) in your arm, shoulder or hand. 1 = none 1 1  During the past week, how much difficulty have you had sleeping because of the pain in your arm, shoulder or hand?  2 = Mild difficulty 2 2   (A QuickDASH score may not be calculated if there is greater than 1 missing item.)  Quick Dash Disability/Symptom Score: 24  03/26/24: 29.5 05/14/2024 11.36  Minimally Clinically Important Difference (MCID): 15-20 points  Flavio, F. et al. (2013). Minimally clinically important difference of the disabilities of the arm, shoulder, and hand outcome measures (DASH) and its shortened version (Quick DASH). Journal of Orthopaedic & Sports Physical Therapy, 44(1), 30-39)   COGNITION: Overall cognitive status: Within functional limits for tasks assessed     SENSATION: Not tested  POSTURE: Mild shoulder rounding, mild forward  UPPER EXTREMITY ROM: WFL BIL, pain with end range L shoulder abduction noted as pinching   UPPER EXTREMITY MMT:   Active MMT Right eval Left eval Right 03/26/2024 Left 03/26/2024  Right 05/14/2024 Left 05/14/2024  Shoulder flexion 5 4+ 5 4+ 5 5  Shoulder extension        Shoulder abduction 4+ 4- 4+ 4 4+ 4+  Shoulder adduction        Shoulder internal rotation 4+ 4+ 4 4+ 4+ 4+  Shoulder external rotation 4 4+ 4 4+ 4+ 4  Elbow flexion        Elbow extension        Wrist flexion  Wrist extension        Wrist ulnar deviation        Wrist radial  deviation        Wrist pronation        Wrist supination        Grip strength  46lb 34lb  45lb  40lb  45lb 35lb   (Blank rows = not tested)   SHOULDER SPECIAL TESTS: Impingement tests: deferred Rotator cuff assessment: Gerber lift off test: negative                                                                                                                             TREATMENT DATE:   [facility] Adult PT Treatment:                                                DATE: 05/14/2024  Therapeutic Activity:  Reassessment of objective measures and subjective assessment regarding progress towards established goals and updated plan for addressing remaining deficits and rehab goals.  UBE level 2, 3' fwd/bwd while gathering subjective and planning session with patient    [facility] Adult PT Treatment:                                                DATE: 04/23/2024  Therapeutic Activity:  UBE level 1 2' fwd/bwd while gathering subjective and planning session with patient Pball x 10 flexion, x 10 L diagonal  Finger Ladder flexion x 6  Finger Ladder abduction x 6  Wall plank on small pball 2 x 20 sec with mild perturbations  Body Blade 2 x 30 sec each 0 deg flexion  90 deg flexion, punch  90 deg abduction, punch   [facility] Adult PT Treatment:                                                DATE: 04/02/2024   Therapeutic Activity:  UBE level 1 2' fwd/bwd while gathering subjective and planning session with patient Pball x 10 flexion, x 10 L diagonal  Finger Ladder flexion x 6  Finger Ladder abduction x 6  Wall plank 2 x 20 sec Wall plank on small pball 2 x 20 sec with mild perturbations       PATIENT EDUCATION: Education details: reviewed initial home exercise program; discussion of POC, prognosis and goals for skilled PT   Person educated: Patient Education method: Explanation, Demonstration, and Handouts Education comprehension: verbalized understanding, returned demonstration, and needs  further education  HOME EXERCISE PROGRAM: Access Code: [email or web address] URL: [email or web address] Date: 03/13/2024  Prepared by: Marko Molt  Exercises - Isometric Shoulder Flexion at Wall  - 1 x daily - 7 x weekly - 2 sets - 10 reps - 3-5 sec hold - Standing Isometric Shoulder Internal Rotation at Doorway  - 1 x daily - 7 x weekly - 2 sets - 10 reps - 3-5 sec hold - Isometric Shoulder Extension at Wall  - 1 x daily - 7 x weekly - 2 sets - 10 reps - 3-5 sec hold - Standing Isometric Shoulder External Rotation with Doorway  - 1 x daily - 7 x weekly - 2 sets - 10 reps - 3-5 sec hold - 15 Minute Progressive Muscle Relaxation for Pain Relief   ASSESSMENT:  CLINICAL IMPRESSION: 05/14/24 Denise Hendrix is demonstrating good improvement of overall activity tolerance, including most her her job-related responsibilities. She continues to have difficulty with heavy lifting as needed for her job. She is encouraged to continue with IHEP between sessions. Plan is to decrease frequency of visits to biweekly for 6 weeks to address her remaining rehab goals. r  EVAL: Denise Hendrix is a 23 y.o. female who was seen today for physical therapy evaluation and treatment for L shoulder pain that began after a heavy item fell onto her L shoulder while at work. She is demonstrating ful L shouler AROM, decreased L shoulder strength, and decreased postural mm endurance. She has related pain and difficulty with heavy lifting, overhead reaching, overhead lifting, prolonged carrying of tablet at work, along with difficulty with any activities that requires force or impact through her arm. She is limited with her work related activities. She requires skilled PT services at this time to address relevant deficits and improve overall function.     OBJECTIVE IMPAIRMENTS: decreased endurance, decreased strength, impaired UE functional use, improper body mechanics, postural dysfunction, and pain.   ACTIVITY LIMITATIONS:  carrying, lifting, sleeping, and reach over head  PARTICIPATION LIMITATIONS: meal prep, cleaning, and occupation  PERSONAL FACTORS: Past/current experiences and Time since onset of injury/illness/exacerbation are also affecting patient's functional outcome.   REHAB POTENTIAL: Good  CLINICAL DECISION MAKING: Stable/uncomplicated  EVALUATION COMPLEXITY: Low   GOALS: Goals reviewed with patient? YES  SHORT TERM GOALS: Target date: 03/06/2024   Patient will be independent with initial home program at least 3 days/week.  Baseline: provided at eval Goal Status: MET  2.  Patient will demonstrate improved postural awareness for at least 15 minutes while seated without need for cueing from PT.  Baseline: see objective measures Goal Status: MET  3.  Patient will demonstrate improved shoulder abduction strength to 4+/5 MMT or greater. Baseline: 4-/5 03/26/2024: see MMT chart above  Goal status: MET with exception of L ER MMT score   LONG TERM GOALS: Target date: 06/25/2024    Patient will report improved overall functional ability with Quick Dash score of 14 or less.  Baseline: 24 03/26/2024:  29.5 05/14/2024: 11.4 Goal Status: MET   2.  Patient will demonstrate ability to perform floor to waist lifting of at least 40# using appropriate body mechanics and with no more than minimal pain in order to safely perform normal daily/occupational tasks.   Baseline: occasionally lifting about 24lb of lemonade with difficulty   03/26/2024: able to lift 20 lb from waist to shoulder height (more specific to work activity), but requires cueing for improved lifting mechanics   05/14/2024: able to lift 30 lb from waist to shoulder height    Goal Status: PROGRESSING; goal addended to be specific to work related  goal of lifting 40# container of ice cream    3.  Patient will demonstrate ability to perform overhead lifting of at least 10# using appropriate body mechanics and with no more than minimal  pain in order to safely perform normal daily/occupational tasks.   Baseline: pain and difficulty   03/26/2024: able to perform BIL lifting up to 10# with minimal pain and proper technique  Goal Status: MET   4.  Patient will report 3/10 pain at worst with daily activities, including light-to-moderate household chores.  Baseline: 6-8/10  03/26/2024 6/10  05/14/2024: 6/10 with lifting activities, 0/10 with all other activities  Goal status: MET    PLAN:  PT FREQUENCY: every other week  PT DURATION: 6 weeks  PLANNED INTERVENTIONS: 97164- PT Re-evaluation, 97750- Physical Performance Testing, 97110-Therapeutic exercises, 97530- Therapeutic activity, 97112- Neuromuscular re-education, 97535- Self Care, [id number]- Manual therapy, [id number]- Electrical stimulation (unattended), [phone]- Ionotophoresis 4mg /ml Dexamethasone, Patient/Family education, Taping, Joint mobilization, Cryotherapy, and Moist heat  PLAN FOR NEXT SESSION: focused on stability with movement, progress towards remaining lifting goal, plan for d/c with IHEP after 6 weeks (as of 05/14/2024)   Marko Molt, PT, DPT  05/18/2024 9:11 PM

## 2024-05-19 ENCOUNTER — Encounter: Payer: Self-pay | Admitting: Radiology

## 2024-06-04 ENCOUNTER — Ambulatory Visit: Payer: Self-pay

## 2024-06-19 ENCOUNTER — Ambulatory Visit

## 2024-06-25 ENCOUNTER — Ambulatory Visit: Payer: Self-pay | Attending: Surgical

## 2024-06-25 DIAGNOSIS — M25512 Pain in left shoulder: Secondary | ICD-10-CM | POA: Insufficient documentation

## 2024-06-25 NOTE — Therapy (Signed)
 OUTPATIENT PHYSICAL THERAPY NOTE DISCHARGE SUMMARY    Patient Name: Denise Hendrix MRN: [id number] DOB:11/30/2000, 23 y.o., female Today's Date: 06/25/2024   PHYSICAL THERAPY DISCHARGE SUMMARY  Visits from Start of Care: 9   Current functional level related to goals / functional outcomes: See objective findings/assessment  Remaining deficits: See objective findings/assessment    Education / Equipment: See today's treatment/assessment      Patient agrees to discharge. Patient goals were met. Patient is being discharged due to maximized rehab potential.     END OF SESSION:   PT End of Session - 06/25/24 1549     Visit Number 9    Number of Visits 12    Date for Recertification  06/25/24    Authorization Type [employer]    PT Start Time 1545    PT Stop Time 1605    PT Time Calculation (min) 20 min    Behavior During Therapy [facility] for tasks assessed/performed              No past medical history on file. No past surgical history on file. There are no active problems to display for this patient.   PCP: Dow Ozell PARAS, PA  REFERRING PROVIDER:  Shirly Carlin CROME, PA-C   REFERRING DIAG:  [phone] (ICD-10-CM) - Acute pain of left shoulder  Left shoulder PT 1-2x/week x 6 weeks Focus on strengthening exercises, symptomatic labral pathology  THERAPY DIAG:  Left shoulder pain, unspecified chronicity  Rationale for Evaluation and Treatment: Rehabilitation  ONSET DATE: 11/19/2023   SUBJECTIVE:                                                                                                                                                                                      SUBJECTIVE STATEMENT: Patient reports that she has been doing better with her shoulder. However, she restricts her OH lifting and heavy lifting and work, especially towards the end of the day. She reports having some pinching in her shoulder when laying down at night. She otherwise is  able to perform her daily and occupational activities with minimal-to-no-pain.   EVAL: Patient reports to PT with L shoulder pain that has been present since something fell onto the back of L shoulder. She has trouble holding tablet for extended periods of time while working at [employer]. She states that her arm will give out on her, and that weakness is a bigger problem for her than pain. She would like to be able to fulfill all her work requirements including lifting 35lbs. Still lifting 3 gallons of lemonade; not lifting boxes of ice cream 45-48lb.   Hand dominance: Right   PERTINENT HISTORY:  No relevant PMHx   PAIN:  Are you having pain? Yes: NPRS scale: 6/10 current, 8/10 worst Pain location: posterior L shoulder Pain description: heavy, tense, pinching (with lifting)  Aggravating factors: lifting, OH reaching lifting, sporadic movements  Relieving factors: Voltaren gel, aleve , tylenol     PRECAUTIONS: None  RED FLAGS: None   WEIGHT BEARING RESTRICTIONS: No  FALLS:  Has patient fallen in last 6 months? 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  DIAGNOSTIC FINDINGS:  11/30/2023 L shoulder XRAY  AP, Scap Y, axillary views of left shoulder reviewed.  No fracture or  dislocation.  No abnormality noted to the glenohumeral or  acromioclavicular joints.  No abnormal acromiohumeral interval   PATIENT SURVEYS:  Quick Dash:  QUICK DASH  Please rate your ability do the following activities in the last week by selecting the number below the appropriate response.   Activities Rating 03/26/2024 05/14/2024   Open a tight or new jar.  3 = Moderate  difficulty 3 2  Do heavy household chores (e.g., wash walls, floors). 3 = Moderate difficulty 3 1  Carry a shopping bag or briefcase 2 = Mild difficulty 3 1  Wash your back. 1 = No difficulty  1 1  Use a knife to cut food. 1 = No difficulty  1 1  Recreational activities in which you take some force or impact through your arm, shoulder or hand (e.g., golf, hammering, tennis, etc.). 3 = Moderate difficulty 3 2  During the past week, to what extent has your arm, shoulder or hand problem interfered with your normal social activities with family, friends, neighbors or groups?  3 = Moderately 2 1  During the past week, were you limited in your work or other regular daily activities as a result of your arm, shoulder or hand problem? 3 = Moderately limited 2 2  Rate the severity of the following symptoms in the last week: Arm, Shoulder, or hand pain. 3 = Moderate 3 2  Rate the severity of the following symptoms in the last week: Tingling (pins and needles) in your arm, shoulder or hand. 1 = none 1 1  During the past week, how much difficulty have you had sleeping because of the pain in your arm, shoulder or hand?  2 = Mild difficulty 2 2   (A QuickDASH score may not be calculated if there is greater than 1 missing item.)  Quick Dash Disability/Symptom Score: 24  03/26/24: 29.5 05/14/2024 11.36  Minimally Clinically Important Difference (MCID): 15-20 points  Flavio, F. et al. (2013). Minimally clinically important difference of the disabilities of the arm, shoulder, and hand outcome measures (DASH) and its shortened version (Quick DASH). Journal of Orthopaedic & Sports Physical Therapy, 44(1), 30-39)   COGNITION: Overall cognitive status: Within functional limits for tasks assessed     SENSATION: Not tested  POSTURE: Mild shoulder rounding, mild forward  UPPER EXTREMITY ROM: WFL BIL, pain with end range L shoulder abduction noted as pinching   UPPER EXTREMITY MMT:   Active MMT  Right eval Left eval Right 03/26/2024 Left 03/26/2024  Right 05/14/2024 Left 05/14/2024  Shoulder flexion 5 4+ 5 4+ 5 5  Shoulder extension  Shoulder abduction 4+ 4- 4+ 4 4+ 4+  Shoulder adduction        Shoulder internal rotation 4+ 4+ 4 4+ 4+ 4+  Shoulder external rotation 4 4+ 4 4+ 4+ 4  Elbow flexion        Elbow extension        Wrist flexion        Wrist extension        Wrist ulnar deviation        Wrist radial deviation        Wrist pronation        Wrist supination        Grip strength  46lb 34lb  45lb  40lb  45lb 35lb   (Blank rows = not tested)   SHOULDER SPECIAL TESTS: Impingement tests: deferred Rotator cuff assessment: Gerber lift off test: negative                                                                                                                             TREATMENT DATE:    [facility] Adult PT Treatment:                                                DATE: 06/25/2024   Therapeutic Activity:  Reassessment of objective measures and subjective assessment regarding progress towards established goals and plan for independence with prescribed home program following discharge from PT Updated HEP and reviewed with patient  Patient education and further encouragement to continue address stress management as able        PATIENT EDUCATION: Education details: reviewed initial home exercise program; discussion of POC, prognosis and goals for skilled PT   Person educated: Patient Education method: Explanation, Demonstration, and Handouts Education comprehension: verbalized understanding, returned demonstration, and needs further education  HOME EXERCISE PROGRAM: Access Code: [email or web address] URL: [email or web address] Date: 03/13/2024 Prepared by: Marko Molt  Exercises - Isometric Shoulder Flexion at Wall  - 1 x daily - 7 x weekly - 2 sets - 10 reps - 3-5 sec hold - Standing Isometric Shoulder Internal Rotation at Doorway  - 1 x daily  - 7 x weekly - 2 sets - 10 reps - 3-5 sec hold - Isometric Shoulder Extension at Wall  - 1 x daily - 7 x weekly - 2 sets - 10 reps - 3-5 sec hold - Standing Isometric Shoulder External Rotation with Doorway  - 1 x daily - 7 x weekly - 2 sets - 10 reps - 3-5 sec hold - 15 Minute Progressive Muscle Relaxation for Pain Relief   ASSESSMENT:  CLINICAL IMPRESSION: 06/25/24 Jaeley is demonstrating good improvement of overall activity tolerance, including most her her job-related responsibilities. She reports confidence with performing her normal daily activities and occupational duties with minimal pain. She agrees to continue with HEP and incorporating variety  of stress management strategies as able. She will be discharged from skilled PT at this time. She should f/u with MD as needed.   EVAL: Liv is a 23 y.o. female who was seen today for physical therapy evaluation and treatment for L shoulder pain that began after a heavy item fell onto her L shoulder while at work. She is demonstrating ful L shouler AROM, decreased L shoulder strength, and decreased postural mm endurance. She has related pain and difficulty with heavy lifting, overhead reaching, overhead lifting, prolonged carrying of tablet at work, along with difficulty with any activities that requires force or impact through her arm. She is limited with her work related activities. She requires skilled PT services at this time to address relevant deficits and improve overall function.     OBJECTIVE IMPAIRMENTS: decreased endurance, decreased strength, impaired UE functional use, improper body mechanics, postural dysfunction, and pain.   ACTIVITY LIMITATIONS: carrying, lifting, sleeping, and reach over head  PARTICIPATION LIMITATIONS: meal prep, cleaning, and occupation  PERSONAL FACTORS: Past/current experiences and Time since onset of injury/illness/exacerbation are also affecting patient's functional outcome.   REHAB POTENTIAL:  Good  CLINICAL DECISION MAKING: Stable/uncomplicated  EVALUATION COMPLEXITY: Low   GOALS: Goals reviewed with patient? YES  SHORT TERM GOALS: Target date: 03/06/2024   Patient will be independent with initial home program at least 3 days/week.  Baseline: provided at eval Goal Status: MET  2.  Patient will demonstrate improved postural awareness for at least 15 minutes while seated without need for cueing from PT.  Baseline: see objective measures Goal Status: MET  3.  Patient will demonstrate improved shoulder abduction strength to 4+/5 MMT or greater. Baseline: 4-/5 03/26/2024: see MMT chart above  Goal status: MET with exception of L ER MMT score   LONG TERM GOALS: Target date: 06/25/2024    Patient will report improved overall functional ability with Quick Dash score of 14 or less.  Baseline: 24 03/26/2024:  29.5 05/14/2024: 11.4 Goal Status: MET   2.  Patient will demonstrate ability to perform floor to waist lifting of at least 40# using appropriate body mechanics and with no more than minimal pain in order to safely perform normal daily/occupational tasks.   Baseline: occasionally lifting about 24lb of lemonade with difficulty   03/26/2024: able to lift 20 lb from waist to shoulder height (more specific to work activity), but requires cueing for improved lifting mechanics   05/14/2024: able to lift 30 lb from waist to shoulder height   goal addended to be specific to work related goal of lifting 40# container of ice cream  Goal Status: nearly met; able to complete task of moving ice cream by carrying the contents in bags instead of all at once    3.  Patient will demonstrate ability to perform overhead lifting of at least 10# using appropriate body mechanics and with no more than minimal pain in order to safely perform normal daily/occupational tasks.   Baseline: pain and difficulty   03/26/2024: able to perform BIL lifting up to 10# with minimal pain and proper  technique  Goal Status: MET   4.  Patient will report 3/10 pain at worst with daily activities, including light-to-moderate household chores.  Baseline: 6-8/10  03/26/2024 6/10  05/14/2024: 6/10 with lifting activities, 0/10 with all other activities  Goal status: MET    PLAN:  PT FREQUENCY: every other week  PT DURATION: 6 weeks  PLANNED INTERVENTIONS: 97164- PT Re-evaluation, 97750- Physical Performance Testing, 97110-Therapeutic exercises,  02469- Therapeutic activity, [id number]- Neuromuscular re-education, [id number]- Self Care, [id number]- Manual therapy, [id number]- Electrical stimulation (unattended), [phone]- Ionotophoresis 4mg /ml Dexamethasone, Patient/Family education, Taping, Joint mobilization, Cryotherapy, and Moist heat     Marko Molt, PT, DPT  06/25/2024 4:17 PM
